# Patient Record
Sex: Female | Born: 1960 | Race: White | Hispanic: No | Marital: Married | Smoking: Never smoker
Health system: Southern US, Community
[De-identification: ages and names within clinical notes are randomized; demographics above are authoritative.]

## PROBLEM LIST (undated history)

## (undated) DIAGNOSIS — M810 Age-related osteoporosis without current pathological fracture: Secondary | ICD-10-CM

## (undated) DIAGNOSIS — F419 Anxiety disorder, unspecified: Secondary | ICD-10-CM

## (undated) DIAGNOSIS — E039 Hypothyroidism, unspecified: Secondary | ICD-10-CM

## (undated) DIAGNOSIS — E559 Vitamin D deficiency, unspecified: Secondary | ICD-10-CM

## (undated) DIAGNOSIS — F32A Depression, unspecified: Secondary | ICD-10-CM

## (undated) DIAGNOSIS — R413 Other amnesia: Secondary | ICD-10-CM

## (undated) HISTORY — DX: Anxiety disorder, unspecified: F41.9

## (undated) HISTORY — PX: BREAST BIOPSY: SHX20

## (undated) HISTORY — DX: Depression, unspecified: F32.A

## (undated) HISTORY — DX: Other amnesia: R41.3

## (undated) HISTORY — DX: Vitamin D deficiency, unspecified: E55.9

## (undated) HISTORY — PX: ABDOMINAL HYSTERECTOMY: SHX81

## (undated) HISTORY — PX: BREAST CYST ASPIRATION: SHX578

## (undated) HISTORY — PX: GALLBLADDER SURGERY: SHX652

## (undated) HISTORY — DX: Age-related osteoporosis without current pathological fracture: M81.0

## (undated) HISTORY — DX: Hypothyroidism, unspecified: E03.9

---

## 2006-01-15 HISTORY — PX: ABDOMINAL HYSTERECTOMY: SHX81

## 2018-05-29 ENCOUNTER — Encounter: Payer: Self-pay | Admitting: Cardiology

## 2018-05-29 ENCOUNTER — Other Ambulatory Visit: Payer: Self-pay

## 2018-05-29 ENCOUNTER — Ambulatory Visit (INDEPENDENT_AMBULATORY_CARE_PROVIDER_SITE_OTHER): Payer: BLUE CROSS/BLUE SHIELD | Admitting: Cardiology

## 2018-05-29 VITALS — BP 112/68 | HR 57 | Ht >= 80 in | Wt 128.9 lb

## 2018-05-29 DIAGNOSIS — R0789 Other chest pain: Secondary | ICD-10-CM | POA: Diagnosis not present

## 2018-05-29 DIAGNOSIS — R002 Palpitations: Secondary | ICD-10-CM | POA: Diagnosis not present

## 2018-05-29 NOTE — Progress Notes (Signed)
Primary Physician/Referring:  Holland Commons, FNP  Patient ID: Angie Robinson, female    DOB: January 05, 1961, 58 y.o.   MRN: [id number]  Chief Complaint  Patient presents with  . Palpitations  . New Patient (Initial Visit)    HPI: Angie Robinson  is a 58 y.o. female  with no significant past medical history referred to [place] by Dr. Deland Pretty for evaluation of palpitations.   Reports that a few weeks ago she had some chest tightness and palpitations. States that she was under a lot of stress at that time. Symptoms have been better since she has had less stress. Palpitations described as fluttering lasting for a few seconds. She has not had any further episodes. No associated shortness of breath.  Reports she had some breaking out in cold sweat at night. A few days ago had sensation of balloon inflating in her chest and then deflated. Generally occurs while sitting and resting, but she has correlated with this about thinking about something stressful or upseting.  She does run and bike 2 miles per day. She has not noticed symptoms with exertion. She has recently changed her diet and states that she had actually increased her salt intake a few years ago for palpitations that helped, but with her now being on a diet her salt intake is less and she actually feels worse.  She is originally from [place], [place] and moved to [facility] in November. She works as a Scientist, forensic.  History reviewed. No pertinent past medical history.  Past Surgical History:  Procedure Laterality Date  . ABDOMINAL HYSTERECTOMY    . GALLBLADDER SURGERY      Social History   Socioeconomic History  . Marital status: Married    Spouse name: Not on file  . Number of children: 3  . Years of education: Not on file  . Highest education level: Not on file  Occupational History  . Not on file  Social Needs  . Financial resource strain: Not on file  . Food insecurity:    Worry: Not on file    Inability: Not on file   . Transportation needs:    Medical: Not on file    Non-medical: Not on file  Tobacco Use  . Smoking status: Never Smoker  . Smokeless tobacco: Never Used  Substance and Sexual Activity  . Alcohol use: Not Currently  . Drug use: Not on file  . Sexual activity: Not on file  Lifestyle  . Physical activity:    Days per week: Not on file    Minutes per session: Not on file  . Stress: Not on file  Relationships  . Social connections:    Talks on phone: Not on file    Gets together: Not on file    Attends religious service: Not on file    Active member of club or organization: Not on file    Attends meetings of clubs or organizations: Not on file    Relationship status: Not on file  . Intimate partner violence:    Fear of current or ex partner: Not on file    Emotionally abused: Not on file    Physically abused: Not on file    Forced sexual activity: Not on file  Other Topics Concern  . Not on file  Social History Narrative  . Not on file    Current Outpatient Medications on File Prior to Visit  Medication Sig Dispense Refill  . Cholecalciferol (VITAMIN D3) 25 MCG (1000 UT)  CAPS Take by mouth daily.     No current facility-administered medications on file prior to visit.     Review of Systems  Constitution: Negative for decreased appetite, malaise/fatigue, weight gain and weight loss.  Eyes: Negative for visual disturbance.  Cardiovascular: Positive for chest pain and palpitations. Negative for claudication, dyspnea on exertion, leg swelling, orthopnea and syncope.  Respiratory: Negative for hemoptysis and wheezing.   Endocrine: Negative for cold intolerance and heat intolerance.  Hematologic/Lymphatic: Does not bruise/bleed easily.  Skin: Negative for nail changes.  Musculoskeletal: Negative for muscle weakness and myalgias.  Gastrointestinal: Negative for abdominal pain, change in bowel habit, nausea and vomiting.  Neurological: Negative for difficulty with  concentration, dizziness, focal weakness and headaches.  Psychiatric/Behavioral: Negative for altered mental status and suicidal ideas.  All other systems reviewed and are negative.     Objective  Blood pressure 112/68, pulse (!) 57, height 7' 8"  (2.337 m), weight 128 lb 14.4 oz (58.5 kg), SpO2 99 %. Body mass index is 10.71 kg/m.    Physical Exam  Constitutional: She is oriented to person, place, and time. Vital signs are normal. She appears well-developed and well-nourished.  HENT:  Head: Normocephalic and atraumatic.  Neck: Normal range of motion.  Cardiovascular: Normal rate, regular rhythm, normal heart sounds and intact distal pulses.  Pulmonary/Chest: Effort normal and breath sounds normal. No accessory muscle usage. No respiratory distress.  Abdominal: Soft. Bowel sounds are normal.  Musculoskeletal: Normal range of motion.  Neurological: She is alert and oriented to person, place, and time.  Skin: Skin is warm and dry.  Vitals reviewed.  Radiology: No results found.  Laboratory examination:   Labs 05/21/2018: CBC normal, serum glucose 82 mg, BUN 15, creatinine 0.8, eGFR greater than 60 mL.  Potassium 4.1, CMP otherwise normal.  Total cholesterol 176, triglycerides 56, HDL 63, LDL 102.  Non-HDL cholesterol 113.  TSH minimally elevated at 4.10 (0.36 - 3.74).  Urine analysis normal.  Vitamin D 24, reduced.  No flowsheet data found. No flowsheet data found. Lipid Panel  No results found for: CHOL, TRIG, HDL, CHOLHDL, VLDL, LDLCALC, LDLDIRECT HEMOGLOBIN A1C No results found for: HGBA1C, MPG TSH No results for input(s): TSH in the last 8760 hours.  Cardiac Studies:     Assessment   Atypical chest pain  Palpitations - Plan: EKG 12-Lead  EKG 05/29/2018: Sinus bradycardia at 56 bpm, normal axis, T wave inversion/ flattening in septal leads, cannot exclude septal infact, but likely normal variant for age. No evidence of ischemia.  Recommendations:   Patient has had  occasional episodes of atypical chest pain associated with increased stress as well as palpitations. She has not had any palpitations for the last few weeks and chest pain has overall improved since not being under significant stress. She is without history of hypertension, diabetes, or hyperlipidemia. She is exercising daily without exertional difficulties. She does have T wave inversion in V2 and T wave flattening in V3 that is likely normal variant for age, but cannot exclude septal infarct. As she has minimal risk factors and has had improvement in symptoms, do not feel that she needs cardiac testing at this time. Discussed continued primary prevention measures. Encouraged her to contact me for any worsening problems or for any new concerns. Will see her back on a PRN basis.    *I have discussed this case with Dr. Einar Gip and he personally examined the patient and participated in formulating the plan.*    Miquel Dunn, MSN,  APRN, FNP-C [facility] [place] Office: [phone] Fax: [phone]

## 2018-05-29 NOTE — Progress Notes (Deleted)
Duplicate note

## 2018-05-31 ENCOUNTER — Encounter: Payer: Self-pay | Admitting: Cardiology

## 2018-11-05 ENCOUNTER — Other Ambulatory Visit: Payer: Self-pay | Admitting: Internal Medicine

## 2018-11-05 DIAGNOSIS — Z1231 Encounter for screening mammogram for malignant neoplasm of breast: Secondary | ICD-10-CM

## 2018-12-16 DIAGNOSIS — U071 COVID-19: Secondary | ICD-10-CM

## 2018-12-16 HISTORY — DX: COVID-19: U07.1

## 2019-01-19 ENCOUNTER — Ambulatory Visit
Admission: RE | Admit: 2019-01-19 | Discharge: 2019-01-19 | Disposition: A | Discharge status: Home / Self Care | Payer: BLUE CROSS/BLUE SHIELD | Source: Ambulatory Visit | Attending: Internal Medicine | Admitting: Internal Medicine

## 2019-01-19 ENCOUNTER — Other Ambulatory Visit: Payer: Self-pay

## 2019-01-19 DIAGNOSIS — Z1231 Encounter for screening mammogram for malignant neoplasm of breast: Secondary | ICD-10-CM

## 2019-03-25 ENCOUNTER — Encounter: Payer: Self-pay | Admitting: *Deleted

## 2019-03-26 ENCOUNTER — Other Ambulatory Visit: Payer: Self-pay

## 2019-03-26 ENCOUNTER — Telehealth: Payer: Self-pay | Admitting: Neurology

## 2019-03-26 ENCOUNTER — Ambulatory Visit: Payer: BLUE CROSS/BLUE SHIELD | Admitting: Neurology

## 2019-03-26 ENCOUNTER — Encounter: Payer: Self-pay | Admitting: Neurology

## 2019-03-26 VITALS — BP 110/64 | HR 60 | Temp 97.3°F | Ht 62.0 in | Wt 125.0 lb

## 2019-03-26 DIAGNOSIS — R413 Other amnesia: Secondary | ICD-10-CM | POA: Diagnosis not present
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
## 2019-03-26 NOTE — Telephone Encounter (Signed)
Called patient and informed her Dr Terrace Arabia ordered a B12 lab on her. If b12 level is low or borderline Dr Terrace Arabia will probalby recommend a supplement. Patient verbalized understanding, appreciation.

## 2019-03-26 NOTE — Telephone Encounter (Signed)
Pt is needing to speak to RN. Pt states that her report says that Vitamine B12 was mentioned but she does not remember it being mentioned to her. Please advise.

## 2019-03-27 LAB — CBC WITH DIFFERENTIAL
Basophils Absolute: 0 10*3/uL (ref 0.0–0.2)
Basos: 1 %
EOS (ABSOLUTE): 0.1 10*3/uL (ref 0.0–0.4)
Eos: 1 %
Hematocrit: 42.5 % (ref 34.0–46.6)
Hemoglobin: 14 g/dL (ref 11.1–15.9)
Immature Grans (Abs): 0 10*3/uL (ref 0.0–0.1)
Immature Granulocytes: 0 %
Lymphocytes Absolute: 1.5 10*3/uL (ref 0.7–3.1)
Lymphs: 42 %
MCH: 31.6 pg (ref 26.6–33.0)
MCHC: 32.9 g/dL (ref 31.5–35.7)
MCV: 96 fL (ref 79–97)
Monocytes Absolute: 0.3 10*3/uL (ref 0.1–0.9)
Monocytes: 9 %
Neutrophils Absolute: 1.7 10*3/uL (ref 1.4–7.0)
Neutrophils: 47 %
RBC: 4.43 x10E6/uL (ref 3.77–5.28)
RDW: 12.4 % (ref 11.7–15.4)
WBC: 3.6 10*3/uL (ref 3.4–10.8)

## 2019-03-27 LAB — COMPREHENSIVE METABOLIC PANEL
ALT: 10 IU/L (ref 0–32)
AST: 17 IU/L (ref 0–40)
Albumin/Globulin Ratio: 2.1 (ref 1.2–2.2)
Albumin: 4.5 g/dL (ref 3.8–4.9)
Alkaline Phosphatase: 52 IU/L (ref 39–117)
BUN/Creatinine Ratio: 22 (ref 9–23)
BUN: 17 mg/dL (ref 6–24)
Bilirubin Total: 0.3 mg/dL (ref 0.0–1.2)
CO2: 24 mmol/L (ref 20–29)
Calcium: 9.1 mg/dL (ref 8.7–10.2)
Chloride: 102 mmol/L (ref 96–106)
Creatinine, Ser: 0.78 mg/dL (ref 0.57–1.00)
GFR calc Af Amer: 98 mL/min/{1.73_m2} (ref >59)
GFR calc non Af Amer: 85 mL/min/{1.73_m2} (ref >59)
Globulin, Total: 2.1 g/dL (ref 1.5–4.5)
Glucose: 89 mg/dL (ref 65–99)
Potassium: 4.4 mmol/L (ref 3.5–5.2)
Sodium: 138 mmol/L (ref 134–144)
Total Protein: 6.6 g/dL (ref 6.0–8.5)

## 2019-03-27 LAB — TSH: TSH: 1.49 u[IU]/mL (ref 0.450–4.500)

## 2019-03-27 LAB — RPR: RPR Ser Ql: NONREACTIVE

## 2019-03-27 LAB — VITAMIN B12: Vitamin B-12: 758 pg/mL (ref 232–1245)

## 2019-03-30 ENCOUNTER — Telehealth: Payer: Self-pay | Admitting: *Deleted

## 2019-03-30 NOTE — Telephone Encounter (Signed)
I spoke to the patient and provided her with the lab results below.

## 2019-03-30 NOTE — Telephone Encounter (Signed)
-----   Message from Levert Feinstein, MD sent at 03/30/2019  3:12 PM EDT ----- Please call patient for normal laboratory result

## 2019-04-07 ENCOUNTER — Telehealth: Payer: Self-pay | Admitting: Neurology

## 2019-04-07 NOTE — Telephone Encounter (Signed)
LVM for pt to call back about scheduling mri  [employer] Auth: [employer] spoke to [place] Ref # [id number]

## 2019-04-07 NOTE — Telephone Encounter (Signed)
spoke to the patient she would like to go to GI order sent to GI patient is aware

## 2019-04-20 ENCOUNTER — Ambulatory Visit: Payer: BLUE CROSS/BLUE SHIELD | Admitting: Neurology

## 2019-04-20 ENCOUNTER — Other Ambulatory Visit: Payer: Self-pay

## 2019-04-20 DIAGNOSIS — R410 Disorientation, unspecified: Secondary | ICD-10-CM | POA: Diagnosis not present

## 2019-04-20 DIAGNOSIS — R413 Other amnesia: Secondary | ICD-10-CM

## 2019-04-23 ENCOUNTER — Telehealth: Payer: Self-pay | Admitting: Neurology

## 2019-04-23 NOTE — Telephone Encounter (Signed)
Open in error

## 2019-04-23 NOTE — Procedures (Signed)
   HISTORY: 59 year old female presented with intermittent memory loss  TECHNIQUE:  This is a routine 16 channel EEG recording with one channel devoted to a limited EKG recording.  It was performed during wakefulness, drowsiness and asleep.  Hyperventilation and photic stimulation were performed as activating procedures.  There are minimum muscle and movement artifact noted.  Upon maximum arousal, posterior dominant waking rhythm consistent of rhythmic alpha range activity, with frequency of 10 hz. Activities are symmetric over the bilateral posterior derivations and attenuated with eye opening.  Hyperventilation produced mild/moderate buildup with higher amplitude and the slower activities noted.  Photic stimulation did not alter the tracing.  During EEG recording, patient developed drowsiness and entered sleep, sleep EEG demonstrated architecture, there were frontal centrally dominant vertex waves and symmetric sleep spindles noted.  During EEG recording, there was no epileptiform discharge noted.  EKG demonstrate sinus rhythm, with heart rate of 42 beats a minute  CONCLUSION: This is a  normal awake and asleep EEG.  There is no electrodiagnostic evidence of epileptiform discharge.  Levert Feinstein, M.D. Ph.D.  [facility] [address] [place], [place] [address] Phone: [phone] Fax:      [phone]

## 2019-05-06 ENCOUNTER — Inpatient Hospital Stay: Admission: RE | Admit: 2019-05-06 | Payer: BLUE CROSS/BLUE SHIELD | Source: Ambulatory Visit

## 2019-05-07 ENCOUNTER — Ambulatory Visit: Payer: Self-pay | Attending: Internal Medicine

## 2019-05-07 DIAGNOSIS — Z23 Encounter for immunization: Secondary | ICD-10-CM

## 2019-05-07 NOTE — Progress Notes (Signed)
   Covid-19 Vaccination Clinic  Name:  Dnyla Antonetti    MRN: [id number] DOB: [birth year]  05/07/2019  Ms. Heppler was observed post Covid-19 immunization for 15 minutes without incident. She was provided with Vaccine Information Sheet and instruction to access the V-Safe system.   Ms. Lett was instructed to call 911 with any severe reactions post vaccine: Marland Kitchen Difficulty breathing  . Swelling of face and throat  . A fast heartbeat  . A bad rash all over body  . Dizziness and weakness   Immunizations Administered    Name Date Dose VIS Date Route   Pfizer COVID-19 Vaccine 05/07/2019  3:48 PM 0.3 mL 03/11/2018 Intramuscular   Manufacturer: [employer], [employer]   Lot: [id number]   NDC: [address]-8

## 2019-05-25 ENCOUNTER — Telehealth: Payer: Self-pay | Admitting: Neurology

## 2019-05-25 NOTE — Telephone Encounter (Signed)
Per Dr. Zannie Cove EEG result note from 04/23/19: "This is a  normal awake and asleep EEG.  There is no electrodiagnostic evidence of epileptiform discharge."  I called pt. I advised her of the EEG results. Pt reports that her symptoms are better. Her PCP advised her that her symptoms may have been COVID related.   She is unsure if she wants to proceed with the MRI since her symptoms are better. She has a follow up in September with Maralyn Sago, NP to discuss her memory loss further. She may decide to delay MRI until after that visit. She will let us know if she changes her mind regarding the MRI in the meantime.  Pt verbalized understanding of results. Pt had no further questions at this time but will call back if questions or concerns arise.

## 2019-05-25 NOTE — Telephone Encounter (Signed)
Pt called wanting to be informed of her EEG result's. Please advise.

## 2019-06-01 ENCOUNTER — Ambulatory Visit: Payer: BLUE CROSS/BLUE SHIELD | Attending: Internal Medicine

## 2019-06-01 DIAGNOSIS — Z23 Encounter for immunization: Secondary | ICD-10-CM

## 2019-06-01 NOTE — Progress Notes (Signed)
   Covid-19 Vaccination Clinic  Name:  Angie Robinson    MRN: [id number] DOB: 09-17-1960  06/01/2019  [name] was observed post Covid-19 immunization for 15 minutes without incident. She was provided with Vaccine Information Sheet and instruction to access the V-Safe system.   [name] was instructed to call 911 with any severe reactions post vaccine: Marland Kitchen Difficulty breathing  . Swelling of face and throat  . A fast heartbeat  . A bad rash all over body  . Dizziness and weakness   Immunizations Administered    Name Date Dose VIS Date Route   Pfizer COVID-19 Vaccine 06/01/2019  3:38 PM 0.3 mL 03/11/2018 Intramuscular   Manufacturer: [employer], [employer]   Lot: [id number]   NDC: [address]-2

## 2019-09-24 ENCOUNTER — Ambulatory Visit: Payer: BLUE CROSS/BLUE SHIELD | Admitting: Neurology

## 2020-04-08 ENCOUNTER — Other Ambulatory Visit: Payer: Self-pay | Admitting: Internal Medicine

## 2020-04-08 DIAGNOSIS — Z1231 Encounter for screening mammogram for malignant neoplasm of breast: Secondary | ICD-10-CM

## 2020-05-31 ENCOUNTER — Other Ambulatory Visit: Payer: Self-pay

## 2020-05-31 ENCOUNTER — Ambulatory Visit
Admission: RE | Admit: 2020-05-31 | Discharge: 2020-05-31 | Disposition: A | Discharge status: Home / Self Care | Payer: BLUE CROSS/BLUE SHIELD | Source: Ambulatory Visit | Attending: Internal Medicine | Admitting: Internal Medicine

## 2020-05-31 DIAGNOSIS — Z1231 Encounter for screening mammogram for malignant neoplasm of breast: Secondary | ICD-10-CM

## 2021-06-05 ENCOUNTER — Other Ambulatory Visit: Payer: Self-pay | Admitting: Registered Nurse

## 2021-06-05 DIAGNOSIS — E7800 Pure hypercholesterolemia, unspecified: Secondary | ICD-10-CM

## 2021-06-09 ENCOUNTER — Ambulatory Visit
Admission: RE | Admit: 2021-06-09 | Discharge: 2021-06-09 | Disposition: A | Discharge status: Home / Self Care | Payer: No Typology Code available for payment source | Source: Ambulatory Visit | Attending: Registered Nurse | Admitting: Registered Nurse

## 2021-06-09 DIAGNOSIS — E7800 Pure hypercholesterolemia, unspecified: Secondary | ICD-10-CM

## 2021-07-17 ENCOUNTER — Other Ambulatory Visit: Payer: BLUE CROSS/BLUE SHIELD

## 2021-07-17 ENCOUNTER — Encounter: Payer: Self-pay | Admitting: Physician Assistant

## 2021-07-17 ENCOUNTER — Ambulatory Visit (INDEPENDENT_AMBULATORY_CARE_PROVIDER_SITE_OTHER): Payer: BLUE CROSS/BLUE SHIELD | Admitting: Physician Assistant

## 2021-07-17 VITALS — BP 113/74 | HR 61 | Ht 62.0 in | Wt 148.4 lb

## 2021-07-17 DIAGNOSIS — R2689 Other abnormalities of gait and mobility: Secondary | ICD-10-CM

## 2021-07-17 DIAGNOSIS — M810 Age-related osteoporosis without current pathological fracture: Secondary | ICD-10-CM

## 2021-07-17 DIAGNOSIS — Z87820 Personal history of traumatic brain injury: Secondary | ICD-10-CM

## 2021-07-17 DIAGNOSIS — F411 Generalized anxiety disorder: Secondary | ICD-10-CM | POA: Diagnosis not present

## 2021-07-17 DIAGNOSIS — E7800 Pure hypercholesterolemia, unspecified: Secondary | ICD-10-CM

## 2021-07-17 DIAGNOSIS — F902 Attention-deficit hyperactivity disorder, combined type: Secondary | ICD-10-CM

## 2021-07-17 DIAGNOSIS — F32A Depression, unspecified: Secondary | ICD-10-CM

## 2021-07-17 DIAGNOSIS — E039 Hypothyroidism, unspecified: Secondary | ICD-10-CM

## 2021-07-17 DIAGNOSIS — E559 Vitamin D deficiency, unspecified: Secondary | ICD-10-CM

## 2021-07-17 MED ORDER — AMPHETAMINE-DEXTROAMPHETAMINE 10 MG PO TABS: ORAL_TABLET | ORAL | 0 refills | Status: DC

## 2021-07-17 NOTE — Progress Notes (Signed)
[facility] MD/PA/NP Initial Note  07/17/2021 1:00 PM Angie Robinson  MRN:  [id number]  Chief Complaint:  Chief Complaint   Establish Care     HPI: Referred by Angie Chester, NP at [facility].  Having problems focusing.  Has had issues all her life, but has gotten worse over the past few years to the point it's affecting her job. Works for [employer] and has to be on top of everything at all times. Gets distracted very easy, gets bored and restless in meetings (thankfully online, so people don't notice) and doesn't hear or comprehend half of what people are saying. She's able to cover things up some but not sure how long she can do that before it negatively affects her job and she may be let go. Job is very stressful so that may be causing some of it. She transferred positions last summer, which was a good move, but still a very stressful environment.   Concerned about possibility of dementia. Has been getting more forgetful, worse in past year. Possibly when she started Lexapro but not sure. Has been experiencing it for at least 5 years though, maybe longer. She'll ask her husband things that he's already told her several times. He gets aggravated with her. Can't name objects in the house and say's 'that thing' a lot, trying to describe to husband what she's referring to. No problems understanding function of an object, like a toothbrush or hairbrush. Not getting lost when driving. No fm h/o early on-set dementia, although Dad has it, is in late 21s.  Also of importance is a TBI many years ago when a horse 'ran over' her. Had frontal and occipital bleed, did not require craniotomy. Lost her memory for awhile, but has been able to lead a nl life.  Reports falling a lot, not sure if that is related to the TBI or has just started in the past few years.  It is usually much worse when she is walking up or down stairs.  The first or the last step is always tricky.  She has fallen  many times tripping over 1 of those.  It is bad enough that her kids will not let her walk with their kids in her arms.  She can hold them once she sitting down but they are concerned she will fall while holding them and both she and their child would get hurt.  Saw Neuro, Dr. Terrace Robinson, over 2 years ago (which she forgot about until I asked if she'd ever had work-up) who recommended labs, MRI brain, EEG, and neuropsych eval. Pt doesn't remember any tests done, chart review shows EEG which was normal both awake and asleep. Phone note 05/25/19 to Dr. Terrace Robinson states pt was better and wanted to hold off on MRI. Was supposed to f/u 09/2019 but I don't see a record of that on chart. Labs on 03/26/2019 CBC nl, CMP nl, TSH nl, B12 nl, RPR non-reactive.  Was started on Lexapro around a year ago, for anxiety. It's helped a lot, but sometimes does get overwhelmed. No PA, just a sense of dread at times. Able to get through it without difficulty though.   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 Not isolating but her circle is small b/c works from home and spends time mostly with family. Sleeps  ok. No different than nl. ADLs and hygiene are nl. Appetite and weight are stable. No SI/HI.  No increased energy with decreased need for sleep, no increased talkativeness, no racing thoughts, no impulsivity or risky behaviors, no increased spending, no increased libido, no grandiosity, no increased irritability or anger, no paranoia, and no hallucinations.   Visit Diagnosis:    ICD-10-CM   1. Attention deficit hyperactivity disorder (ADHD), combined type  F90.2     2. Imbalance  R26.89     3. Generalized anxiety disorder  F41.1     4. History of traumatic brain injury  Z87.820     5. Mild depression  F32.A     6. Hypovitaminosis D  E55.9     7. Acquired hypothyroidism   E03.9     8. Osteoporosis, unspecified osteoporosis type, unspecified pathological fracture presence  M81.0     9. Pure hypercholesterolemia  E78.00       Past Psychiatric History:   H/o anxiety, depression   Past medications for mental health diagnoses include: Lexapro has helped anxiety, Zoloft for a long time but quit working.   H/O of TBI.  Cracked her skull after a horse ran over her, had bleeding, frontal and occipital, didn't need surgery, the bleeding finally resolved. Lost her memory for awhile.    Past Medical History:  Past Medical History:  Diagnosis Date   Anxiety    COVID-19 12/2018   Depression    Hypothyroid    Memory loss    Osteoporosis    Vitamin D deficiency     Past Surgical History:  Procedure Laterality Date   ABDOMINAL HYSTERECTOMY  2008   BREAST BIOPSY     unsure laterality or date   BREAST CYST ASPIRATION     multiple when she was young   GALLBLADDER SURGERY      Family Psychiatric History: see below  Family History:  Family History  Problem Relation Age of Onset   Depression Mother    Ovarian cancer Mother    Dementia Father    Depression Sister    Anxiety disorder Brother    Depression Brother    Depression Maternal Grandfather    Dementia Maternal Grandmother    Alcohol abuse Paternal Grandfather    Dementia Paternal Grandmother    Anxiety disorder Daughter    Anxiety disorder Daughter    Anxiety disorder Daughter     Social History:  Social History   Socioeconomic History   Marital status: Married    Spouse name: Angie Robinson   Number of children: 3   Years of education: Not on file   Highest education level: Bachelor's degree (e.g., BA, AB, BS)  Occupational History    Comment: [employer]  Tobacco Use   Smoking status: Never   Smokeless tobacco: Never  Vaping Use   Vaping Use: Never used  Substance and Sexual Activity   Alcohol use: Not Currently   Drug use: Never   Sexual activity: Not on file  Other Topics Concern    Not on file  Social History Narrative   She works for [employer]. Works from home, Proofreader.   Lives with husband, on a farm. Kids are grown and on their own.      Legal-none   Caffeine-1 serving   Religion-Christian   Social Determinants of Health   Financial Resource Strain: Not on file  Food Insecurity: No Food Insecurity (07/17/2021)   Hunger Vital Sign    Worried About Running Out of Food  in the Last Year: Never true    Ran Out of Food in the Last Year: Never true  Transportation Needs: No Transportation Needs (07/17/2021)   PRAPARE - Administrator, Civil Service (Medical): No    Lack of Transportation (Non-Medical): No  Physical Activity: Sufficiently Active (07/17/2021)   Exercise Vital Sign    Days of Exercise per Week: 7 days    Minutes of Exercise per Session: 150+ min  Stress: Stress Concern Present (07/17/2021)   [employer] of Occupational Health - Occupational Stress Questionnaire    Feeling of Stress : Rather much  Social Connections: Moderately Integrated (07/17/2021)   Social Connection and Isolation Panel [NHANES]    Frequency of Communication with Friends and Family: More than three times a week    Frequency of Social Gatherings with Friends and Family: More than three times a week    Attends Religious Services: More than 4 times per year    Active Member of [employer] or Organizations: No    Attends Banker Meetings: Never    Marital Status: Married    Allergies:  Allergies  Allergen Reactions   Other     gluten   Ivp Dye [Iodinated Contrast Media] Rash    Metabolic Disorder Labs: No results found for: "HGBA1C", "MPG" No results found for: "PROLACTIN" No results found for: "CHOL", "TRIG", "HDL", "CHOLHDL", "VLDL", "LDLCALC" Lab Results  Component Value Date   TSH 1.490 03/26/2019    Therapeutic Level Labs: No results found for: "LITHIUM" No results found for: "VALPROATE" No results found for: "CBMZ"  Current  Medications: Current Outpatient Medications  Medication Sig Dispense Refill   amphetamine-dextroamphetamine (ADDERALL) 10 MG tablet 1/2 po qam for 2 weeks and then may increase to 1 po q AM. 30 tablet 0   calcium carbonate (TUMS - DOSED IN MG ELEMENTAL CALCIUM) 500 MG chewable tablet Chew 1 tablet by mouth daily.     Cholecalciferol (VITAMIN D3) 25 MCG (1000 UT) CAPS Take by mouth daily.     denosumab (PROLIA) 60 MG/ML SOSY injection Inject 60 mg into the skin every 6 (six) months.     escitalopram (LEXAPRO) 10 MG tablet Take 10 mg by mouth daily.     levothyroxine (SYNTHROID) 25 MCG tablet Take 25 mcg by mouth daily before breakfast.     montelukast (SINGULAIR) 10 MG tablet Take 10 mg by mouth daily.     No current facility-administered medications for this visit.    Medication Side Effects: none  Orders placed this visit:  No orders of the defined types were placed in this encounter.   Psychiatric Specialty Exam:  Review of Systems  Constitutional:  Positive for fatigue.  HENT: Negative.    Eyes: Negative.   Respiratory: Negative.    Cardiovascular: Negative.   Gastrointestinal: Negative.   Endocrine: Negative.   Genitourinary: Negative.   Musculoskeletal: Negative.   Skin: Negative.   Allergic/Immunologic: Negative.   Neurological:        Imbalance. Falls. Memory loss. See HPI.  Hematological: Negative.   Psychiatric/Behavioral:         See HPI    Blood pressure 113/74, pulse 61, height 5\' 2"  (1.575 m), weight 148 lb 6.4 oz (67.3 kg).Body mass index is 27.14 kg/m.  General Appearance: Casual and Well Groomed  Eye Contact:  Good  Speech:  Clear and Coherent and Normal Rate  Volume:  Normal  Mood:  Euthymic  Affect:  Congruent  Thought Process:  Goal Directed  and Descriptions of Associations: Circumstantial  Orientation:  Full (Time, Place, and Person)  Thought Content: Logical   Suicidal Thoughts:  No  Homicidal Thoughts:  No  Memory:   Immediate and recent  memory are decreased.  Remote memory is good most of the time, however surrounding the head injury there are times when she has no memory of those events  Judgement:  Good  Insight:  Good  Psychomotor Activity:  Normal  Concentration:  Concentration: Poor and Attention Span: Poor  Recall:  Good  Fund of Knowledge: Good  Language: Good  Assets:  Desire for Improvement Financial Resources/Insurance Housing Transportation Vocational/Educational  ADL's:  Intact  Cognition: WNL  Prognosis:  Good   See HPI and notes on chart for previous neurology work-up including labs and EEG per Dr. Terrace Robinson in early 2021.  Labs 05/29/2021 CBC normal CMP normal Cholesterol 213, HDL 73, LDL 124, triglycerides 79 TSH 2.39 Vitamin D 29.4  Screenings:  GAD-7    Flowsheet Row Office Visit from 07/17/2021 in [facility]  Total GAD-7 Score 9      Mini-Mental    Flowsheet Row Office Visit from 07/17/2021 in [facility] Office Visit from 03/26/2019 in [facility]  Total Score (max 30 points ) 30 29      PHQ2-9    Flowsheet Row Office Visit from 07/17/2021 in [facility]  PHQ-2 Total Score 1      Animal naming Test 25  Receiving Psychotherapy: No   Treatment Plan/Recommendations:  PDMP reviewed. No recent controlled meds. I provided 65 minutes of face to face time during this encounter, including time spent before and after the visit in records review, medical decision making, counseling pertinent to today's visit, and charting.   Discussed sx of inattention and memory loss, which could be caused from stress, anxiety, depression, untreated ADHD which I think she's had for a long time,  old TBI, and/or dementia.  Several treatment options include:  adding a stimulant for ADHD which can help w/ memory as well, b/c it may be from decreased focus. wean off Lexapro (since she thinks memory worsened when starting it although I don't  think it's the cause) and add Buspar for anxiety instead.  She understands that BuSpar will not help with depression though. Definitely refer back to Neuro, she prefers to get another opinion so will refer to Dr Tat or Dr Karel Jarvis.   We agree to start Adderall and leave the Lexapro the same for now.  I will hold off on BuSpar but those options are still on the table.  We discussed benefits, risks and side effects of Adderall and she accepts.  After I saw the patient I reviewed her labs from 05/29/2021 in further detail.  Her vitamin D is low normal, and for mental health reasons it is best to have vitamin D level at least 50-70, which can improve symptoms of depression.  I discussed this with Angie Chester, NP patient's primary provider who is okay with me starting her on prescription strength vitamin D.  I will recheck the level in 4 to 6 weeks or so.  I will discuss with the patient further at the next visit but in the meantime I will ask one of the CMAs to call her to let her know of this plan.  Start vitamin D 50,000 IUs, 1 p.o. q. 7 days. Start Adderall 10 mg, one half p.o. every morning for 2 weeks and then may increase to 1  p.o. every morning but if she is responding well to 1/2 pill, stay at that. Continue Lexapro 10 mg, 1 p.o. daily. Continue vitamin D 1000 IUs daily.  Recommend multivitamin, B complex, and fish oil. Consider therapy. Return in 4 weeks.  Melony Overly, PA-C

## 2021-07-19 DIAGNOSIS — E7800 Pure hypercholesterolemia, unspecified: Secondary | ICD-10-CM | POA: Insufficient documentation

## 2021-07-19 DIAGNOSIS — F411 Generalized anxiety disorder: Secondary | ICD-10-CM | POA: Insufficient documentation

## 2021-07-19 DIAGNOSIS — M810 Age-related osteoporosis without current pathological fracture: Secondary | ICD-10-CM | POA: Insufficient documentation

## 2021-07-19 DIAGNOSIS — R2689 Other abnormalities of gait and mobility: Secondary | ICD-10-CM | POA: Insufficient documentation

## 2021-07-19 DIAGNOSIS — F32A Depression, unspecified: Secondary | ICD-10-CM | POA: Insufficient documentation

## 2021-07-19 DIAGNOSIS — F902 Attention-deficit hyperactivity disorder, combined type: Secondary | ICD-10-CM | POA: Insufficient documentation

## 2021-07-19 DIAGNOSIS — E039 Hypothyroidism, unspecified: Secondary | ICD-10-CM | POA: Insufficient documentation

## 2021-07-19 DIAGNOSIS — E559 Vitamin D deficiency, unspecified: Secondary | ICD-10-CM | POA: Insufficient documentation

## 2021-07-19 DIAGNOSIS — Z87820 Personal history of traumatic brain injury: Secondary | ICD-10-CM | POA: Insufficient documentation

## 2021-07-19 MED ORDER — VITAMIN D (ERGOCALCIFEROL) 1.25 MG (50000 UNIT) PO CAPS: 50000.0000 [IU] | ORAL_CAPSULE | ORAL | 0 refills | Status: DC

## 2021-07-20 ENCOUNTER — Telehealth: Payer: Self-pay | Admitting: Physician Assistant

## 2021-07-20 NOTE — Telephone Encounter (Signed)
Referral has been sent to [facility]

## 2021-07-20 NOTE — Telephone Encounter (Signed)
Noted  

## 2021-08-09 ENCOUNTER — Other Ambulatory Visit: Payer: Self-pay | Admitting: Internal Medicine

## 2021-08-09 DIAGNOSIS — Z1231 Encounter for screening mammogram for malignant neoplasm of breast: Secondary | ICD-10-CM

## 2021-08-14 ENCOUNTER — Ambulatory Visit (INDEPENDENT_AMBULATORY_CARE_PROVIDER_SITE_OTHER): Payer: BLUE CROSS/BLUE SHIELD | Admitting: Physician Assistant

## 2021-08-14 ENCOUNTER — Encounter: Payer: Self-pay | Admitting: Physician Assistant

## 2021-08-14 DIAGNOSIS — R7989 Other specified abnormal findings of blood chemistry: Secondary | ICD-10-CM | POA: Diagnosis not present

## 2021-08-14 DIAGNOSIS — F32A Depression, unspecified: Secondary | ICD-10-CM

## 2021-08-14 DIAGNOSIS — Z79899 Other long term (current) drug therapy: Secondary | ICD-10-CM

## 2021-08-14 DIAGNOSIS — F902 Attention-deficit hyperactivity disorder, combined type: Secondary | ICD-10-CM

## 2021-08-14 DIAGNOSIS — Z87820 Personal history of traumatic brain injury: Secondary | ICD-10-CM

## 2021-08-14 MED ORDER — AMPHETAMINE-DEXTROAMPHETAMINE 10 MG PO TABS: 5.0000 mg | ORAL_TABLET | Freq: Two times a day (BID) | ORAL | 0 refills | Status: DC

## 2021-08-14 NOTE — Progress Notes (Signed)
[facility] Med Check  Patient ID: LAURENCIA ROMA,  MRN: [id number]  PCP: Merri Brunette, MD  Date of Evaluation: 08/14/2021 Time spent:30 minutes  Chief Complaint:  Chief Complaint   ADHD; Follow-up     HISTORY/CURRENT STATUS: HPI  For routine f/u.  Started Adderall last month.  States it is working really well.  She has been taking 1/2 pill at breakfast and then another half at lunch.  She did try to increase it to 1 pill every morning but the one time she tried that she had a headache all day so she has not tried it again.  It is helping her stay on task more and not get so easily distracted that she cannot do things that are needed.  States she feels better than she has in 30 years.  Patient is able to enjoy things.  Energy and motivation are good.  Work is going well.   No extreme sadness, tearfulness, or feelings of hopelessness.  Sleeps well most of the time. ADLs and personal hygiene are normal.  Appetite has not changed.  Weight is stable.  Denies suicidal or homicidal thoughts.  Vitamin D 50,000 IUs was started at our last visit.  No other changes were made except of course the Adderall.  I had referred her to [facility] neurology and she never got a phone call from them.  Her mother-in-law has been staying with her and her husband for the past 3 weeks so that has been stressful but otherwise no changes in social situation.  Denies dizziness, syncope, seizures, numbness, tingling, tremor, tics, unsteady gait, slurred speech, confusion. Denies muscle or joint pain, stiffness, or dystonia.  Individual Medical History/ Review of Systems: Changes? :No   Past Psychiatric History:    H/o anxiety, depression    Past medications for mental health diagnoses include: Lexapro has helped anxiety, Zoloft for a long time but quit working.    H/O of TBI.  Cracked her skull after a horse ran over her, had bleeding, frontal and occipital, didn't need surgery, the bleeding finally  resolved. Lost her memory for awhile.   Allergies: Other and Ivp dye [iodinated contrast media]  Current Medications:  Current Outpatient Medications:    [START ON 09/15/2021] amphetamine-dextroamphetamine (ADDERALL) 10 MG tablet, Take 0.5 tablets (5 mg total) by mouth 2 (two) times daily with breakfast and lunch., Disp: 30 tablet, Rfl: 0   [START ON 08/16/2021] amphetamine-dextroamphetamine (ADDERALL) 10 MG tablet, Take 0.5 tablets (5 mg total) by mouth 2 (two) times daily with breakfast and lunch., Disp: 30 tablet, Rfl: 0   calcium carbonate (TUMS - DOSED IN MG ELEMENTAL CALCIUM) 500 MG chewable tablet, Chew 1 tablet by mouth daily., Disp: , Rfl:    Vitamin D, Ergocalciferol, (DRISDOL) 1.25 MG (50000 UNIT) CAPS capsule, Take 1 capsule (50,000 Units total) by mouth every 7 (seven) days., Disp: 12 capsule, Rfl: 0   [START ON 10/14/2021] amphetamine-dextroamphetamine (ADDERALL) 10 MG tablet, Take 0.5 tablets (5 mg total) by mouth 2 (two) times daily with breakfast and lunch., Disp: 30 tablet, Rfl: 0   Cholecalciferol (VITAMIN D3) 25 MCG (1000 UT) CAPS, Take by mouth daily., Disp: , Rfl:    denosumab (PROLIA) 60 MG/ML SOSY injection, Inject 60 mg into the skin every 6 (six) months., Disp: , Rfl:    escitalopram (LEXAPRO) 10 MG tablet, Take 10 mg by mouth daily., Disp: , Rfl:    levothyroxine (SYNTHROID) 25 MCG tablet, Take 25 mcg by mouth daily before breakfast., Disp: , Rfl:  montelukast (SINGULAIR) 10 MG tablet, Take 10 mg by mouth daily., Disp: , Rfl:  Medication Side Effects: none  Family Medical/ Social History: Changes?  See HPI  MENTAL HEALTH EXAM:  There were no vitals taken for this visit.There is no height or weight on file to calculate BMI.  General Appearance: Casual and Well Groomed  Eye Contact:  Good  Speech:  Clear and Coherent and Normal Rate  Volume:  Normal  Mood:  Euthymic  Affect:  Congruent  Thought Process:  Goal Directed and Descriptions of Associations: Circumstantial   Orientation:  Full (Time, Place, and Person)  Thought Content: Logical   Suicidal Thoughts:  No  Homicidal Thoughts:  No  Memory:  WNL  Judgement:  Good  Insight:  Good  Psychomotor Activity:  Normal  Concentration:  Concentration: Good and Attention Span: Good  Recall:  Good  Fund of Knowledge: Good  Language: Good  Assets:  Desire for Improvement  ADL's:  Intact  Cognition: WNL  Prognosis:  Good    DIAGNOSES:    ICD-10-CM   1. Attention deficit hyperactivity disorder (ADHD), combined type  F90.2     2. Encounter for long-term (current) use of medications  Z79.899 VITAMIN D 25 Hydroxy (Vit-D Deficiency, Fractures)    3. Low vitamin D level  R79.89 VITAMIN D 25 Hydroxy (Vit-D Deficiency, Fractures)    4. Mild depression  F32.A     5. History of traumatic brain injury  Z87.820       Receiving Psychotherapy: No    RECOMMENDATIONS:  PDMP reviewed.  Adderall filled 07/17/2021. I provided 30 minutes of face to face time during this encounter, including time spent before and after the visit in records review, medical decision making, counseling pertinent to today's visit, and charting.   I am glad to see her feeling so much better!  No change with the Adderall will be made.  Will recheck vitamin D level anywhere between 2 to 4 weeks from now.  Depending on the level then she can go from the prescription strength weekly dose down to daily OTC dosing after the total of 3 months.  Phone number for [facility] neurology was given to her, asking her to call them to schedule an appointment as the referral has already been sent.  Continue Adderall 10 mg, 1/2 pill at breakfast, 1/2 pill at lunch. Continue Lexapro 10 mg, 1 p.o. daily. Continue vitamin D 50,000 IUs once a week. Continue multivitamin and calcium daily. Lab ordered as above. Return in 3 months.  Melony Overly, PA-C

## 2021-08-18 ENCOUNTER — Ambulatory Visit
Admission: RE | Admit: 2021-08-18 | Discharge: 2021-08-18 | Disposition: A | Discharge status: Home / Self Care | Payer: BLUE CROSS/BLUE SHIELD | Source: Ambulatory Visit | Attending: Internal Medicine | Admitting: Internal Medicine

## 2021-08-18 DIAGNOSIS — Z1231 Encounter for screening mammogram for malignant neoplasm of breast: Secondary | ICD-10-CM

## 2021-08-22 ENCOUNTER — Telehealth: Payer: Self-pay | Admitting: Physician Assistant

## 2021-08-22 NOTE — Telephone Encounter (Signed)
Canceled all Rx at [facility] on [address]

## 2021-08-22 NOTE — Telephone Encounter (Signed)
Pt requested to change her refills of Adderall scripts from [facility] to [facility] (starting with the 9/1).  Phone [phone].  Next appt 11/1

## 2021-08-29 NOTE — Telephone Encounter (Signed)
Last filled 8/5. Future refills mail order thru [facility].

## 2021-09-01 ENCOUNTER — Other Ambulatory Visit: Payer: Self-pay

## 2021-09-01 MED ORDER — AMPHETAMINE-DEXTROAMPHETAMINE 10 MG PO TABS
5.0000 mg | ORAL_TABLET | Freq: Two times a day (BID) | ORAL | 0 refills | Status: DC
Start: 2021-09-17 — End: 2021-12-11

## 2021-09-01 MED ORDER — AMPHETAMINE-DEXTROAMPHETAMINE 10 MG PO TABS
5.0000 mg | ORAL_TABLET | Freq: Two times a day (BID) | ORAL | 0 refills | Status: DC
Start: 2021-10-16 — End: 2021-12-11

## 2021-09-01 NOTE — Telephone Encounter (Signed)
Pended.

## 2021-09-05 ENCOUNTER — Encounter: Payer: Self-pay | Admitting: Physician Assistant

## 2021-09-13 ENCOUNTER — Other Ambulatory Visit (INDEPENDENT_AMBULATORY_CARE_PROVIDER_SITE_OTHER): Payer: BLUE CROSS/BLUE SHIELD

## 2021-09-13 ENCOUNTER — Encounter: Payer: Self-pay | Admitting: Physician Assistant

## 2021-09-13 ENCOUNTER — Ambulatory Visit: Payer: BLUE CROSS/BLUE SHIELD | Admitting: Physician Assistant

## 2021-09-13 VITALS — BP 117/73 | HR 63 | Resp 18 | Ht 62.0 in | Wt 149.0 lb

## 2021-09-13 DIAGNOSIS — R4182 Altered mental status, unspecified: Secondary | ICD-10-CM | POA: Diagnosis not present

## 2021-09-13 DIAGNOSIS — R413 Other amnesia: Secondary | ICD-10-CM | POA: Diagnosis not present

## 2021-09-13 LAB — VITAMIN B12: Vitamin B-12: 616 pg/mL (ref 211–911)

## 2021-09-13 NOTE — Patient Instructions (Addendum)
It was a pleasure to see you today at our office.   Recommendations:  MRI of the brain, the radiology office will call you to arrange you appointment EEG  Check labs today Follow up in 1 month   Whom to call:  Memory  decline, memory medications: Call our office [phone]   For psychiatric meds, mood meds: Please have your primary care physician manage these medications.   Counseling regarding caregiver distress, including caregiver depression, anxiety and issues regarding community resources, adult day care programs, adult living facilities, or memory care questions:   Feel free to contact Misty Lisabeth Register, Social Worker at [phone]   For assessment of decision of mental capacity and competency:  Call Dr. Erick Blinks, geriatric psychiatrist at [phone]  For guidance in geriatric dementia issues please call [facility] [phone]  For guidance regarding [facility] and if placement were needed at the facility, contact Sidney Ace, Social Worker tel: [phone]  If you have any severe symptoms of a stroke, or other severe issues such as confusion,severe chills or fever, etc call 911 or go to the ER as you may need to be evaluated further   Feel free to visit Facebook page " [email or web address]" for tips of how to care for people with memory problems.        RECOMMENDATIONS FOR ALL PATIENTS WITH MEMORY PROBLEMS: 1. Continue to exercise (Recommend 30 minutes of walking everyday, or 3 hours every week) 2. Increase social interactions - continue going to [place] and enjoy social gatherings with friends and family 3. Eat healthy, avoid fried foods and eat more fruits and vegetables 4. Maintain adequate blood pressure, blood sugar, and blood cholesterol level. Reducing the risk of stroke and cardiovascular disease also helps promoting better memory. 5. Avoid stressful situations. Live a simple life and avoid aggravations. Organize your time and  prepare for the next day in anticipation. 6. Sleep well, avoid any interruptions of sleep and avoid any distractions in the bedroom that may interfere with adequate sleep quality 7. Avoid sugar, avoid sweets as there is a strong link between excessive sugar intake, diabetes, and cognitive impairment We discussed the Mediterranean diet, which has been shown to help patients reduce the risk of progressive memory disorders and reduces cardiovascular risk. This includes eating fish, eat fruits and green leafy vegetables, nuts like almonds and hazelnuts, walnuts, and also use olive oil. Avoid fast foods and fried foods as much as possible. Avoid sweets and sugar as sugar use has been linked to worsening of memory function.  There is always a concern of gradual progression of memory problems. If this is the case, then we may need to adjust level of care according to patient needs. Support, both to the patient and caregiver, should then be put into place.      FALL PRECAUTIONS: Be cautious when walking. Scan the area for obstacles that may increase the risk of trips and falls. When getting up in the mornings, sit up at the edge of the bed for a few minutes before getting out of bed. Consider elevating the bed at the head end to avoid drop of blood pressure when getting up. Walk always in a well-lit room (use night lights in the walls). Avoid area rugs or power cords from appliances in the middle of the walkways. Use a walker or a cane if necessary and consider physical therapy for balance exercise. Get your eyesight checked regularly.  FINANCIAL OVERSIGHT: Supervision, especially oversight when making  financial decisions or transactions is also recommended.  HOME SAFETY: Consider the safety of the kitchen when operating appliances like stoves, microwave oven, and blender. Consider having supervision and share cooking responsibilities until no longer able to participate in those. Accidents with firearms and  other hazards in the house should be identified and addressed as well.   ABILITY TO BE LEFT ALONE: If patient is unable to contact 911 operator, consider using LifeLine, or when the need is there, arrange for someone to stay with patients. Smoking is a fire hazard, consider supervision or cessation. Risk of wandering should be assessed by caregiver and if detected at any point, supervision and safe proof recommendations should be instituted.  MEDICATION SUPERVISION: Inability to self-administer medication needs to be constantly addressed. Implement a mechanism to ensure safe administration of the medications.   DRIVING: Regarding driving, in patients with progressive memory problems, driving will be impaired. We advise to have someone else do the driving if trouble finding directions or if minor accidents are reported. Independent driving assessment is available to determine safety of driving.   If you are interested in the driving assessment, you can contact the following:  [facility] [employer] [facility] in [place] [phone]  [facility] [phone]  [facility] [phone] or [phone]    Mediterranean Diet A Mediterranean diet refers to food and lifestyle choices that are based on the traditions of countries located on the [employer]. This way of eating has been shown to help prevent certain conditions and improve outcomes for people who have chronic diseases, like kidney disease and heart disease. What are tips for following this plan? Lifestyle  Cook and eat meals together with your family, when possible. Drink enough fluid to keep your urine clear or pale yellow. Be physically active every day. This includes: Aerobic exercise like running or swimming. Leisure activities like gardening, walking, or housework. Get 7-8 hours of sleep each night. If recommended by your health care provider, drink red wine in  moderation. This means 1 glass a day for nonpregnant women and 2 glasses a day for men. A glass of wine equals 5 oz (150 mL). Reading food labels  Check the serving size of packaged foods. For foods such as rice and pasta, the serving size refers to the amount of cooked product, not dry. Check the total fat in packaged foods. Avoid foods that have saturated fat or trans fats. Check the ingredients list for added sugars, such as corn syrup. Shopping  At the grocery store, buy most of your food from the areas near the walls of the store. This includes: Fresh fruits and vegetables (produce). Grains, beans, nuts, and seeds. Some of these may be available in unpackaged forms or large amounts (in bulk). Fresh seafood. Poultry and eggs. Low-fat dairy products. Buy whole ingredients instead of prepackaged foods. Buy fresh fruits and vegetables in-season from local farmers markets. Buy frozen fruits and vegetables in resealable bags. If you do not have access to quality fresh seafood, buy precooked frozen shrimp or canned fish, such as tuna, salmon, or sardines. Buy small amounts of raw or cooked vegetables, salads, or olives from the deli or salad bar at your store. Stock your pantry so you always have certain foods on hand, such as olive oil, canned tuna, canned tomatoes, rice, pasta, and beans. Cooking  Cook foods with extra-virgin olive oil instead of using butter or other vegetable oils. Have meat as a side dish, and have vegetables  or grains as your main dish. This means having meat in small portions or adding small amounts of meat to foods like pasta or stew. Use beans or vegetables instead of meat in common dishes like chili or lasagna. Experiment with different cooking methods. Try roasting or broiling vegetables instead of steaming or sauteing them. Add frozen vegetables to soups, stews, pasta, or rice. Add nuts or seeds for added healthy fat at each meal. You can add these to yogurt,  salads, or vegetable dishes. Marinate fish or vegetables using olive oil, lemon juice, garlic, and fresh herbs. Meal planning  Plan to eat 1 vegetarian meal one day each week. Try to work up to 2 vegetarian meals, if possible. Eat seafood 2 or more times a week. Have healthy snacks readily available, such as: Vegetable sticks with hummus. Greek yogurt. Fruit and nut trail mix. Eat balanced meals throughout the week. This includes: Fruit: 2-3 servings a day Vegetables: 4-5 servings a day Low-fat dairy: 2 servings a day Fish, poultry, or lean meat: 1 serving a day Beans and legumes: 2 or more servings a week Nuts and seeds: 1-2 servings a day Whole grains: 6-8 servings a day Extra-virgin olive oil: 3-4 servings a day Limit red meat and sweets to only a few servings a month What are my food choices? Mediterranean diet Recommended Grains: Whole-grain pasta. Brown rice. Bulgar wheat. Polenta. Couscous. Whole-wheat bread. Orpah Cobb. Vegetables: Artichokes. Beets. Broccoli. Cabbage. Carrots. Eggplant. Green beans. Chard. Kale. Spinach. Onions. Leeks. Peas. Squash. Tomatoes. Peppers. Radishes. Fruits: Apples. Apricots. Avocado. Berries. Bananas. Cherries. Dates. Figs. Grapes. Lemons. Melon. Oranges. Peaches. Plums. Pomegranate. Meats and other protein foods: Beans. Almonds. Sunflower seeds. Pine nuts. Peanuts. Cod. Salmon. Scallops. Shrimp. Tuna. Tilapia. Clams. Oysters. Eggs. Dairy: Low-fat milk. Cheese. Greek yogurt. Beverages: Water. Red wine. Herbal tea. Fats and oils: Extra virgin olive oil. Avocado oil. Grape seed oil. Sweets and desserts: [place] yogurt with honey. Baked apples. Poached pears. Trail mix. Seasoning and other foods: Basil. Cilantro. Coriander. Cumin. Mint. Parsley. Sage. Rosemary. Tarragon. Garlic. Oregano. Thyme. Pepper. Balsalmic vinegar. Tahini. Hummus. Tomato sauce. Olives. Mushrooms. Limit these Grains: Prepackaged pasta or rice dishes. Prepackaged cereal with  added sugar. Vegetables: Deep fried potatoes (french fries). Fruits: Fruit canned in syrup. Meats and other protein foods: Beef. Pork. Lamb. Poultry with skin. Hot dogs. Tomasa Blase. Dairy: Ice cream. Sour cream. Whole milk. Beverages: Juice. Sugar-sweetened soft drinks. Beer. Liquor and spirits. Fats and oils: Butter. Canola oil. Vegetable oil. Beef fat (tallow). Lard. Sweets and desserts: Cookies. Cakes. Pies. Candy. Seasoning and other foods: Mayonnaise. Premade sauces and marinades. The items listed may not be a complete list. Talk with your dietitian about what dietary choices are right for you. Summary The Mediterranean diet includes both food and lifestyle choices. Eat a variety of fresh fruits and vegetables, beans, nuts, seeds, and whole grains. Limit the amount of red meat and sweets that you eat. Talk with your health care provider about whether it is safe for you to drink red wine in moderation. This means 1 glass a day for nonpregnant women and 2 glasses a day for men. A glass of wine equals 5 oz (150 mL). This information is not intended to replace advice given to you by your health care provider. Make sure you discuss any questions you have with your health care provider. Document Released: 08/25/2015 Document Revised: 09/27/2015 Document Reviewed: 08/25/2015 Elsevier Interactive Patient Education  2017 [employer].   We have sent a referral to [facility]  for your MRI and they will call you directly to schedule your appointment. They are located at [address] [facility]. If you need to contact them directly please call [phone].   Your provider has requested that you have labwork completed today. Please go to [facility] ([phone]) on the second floor of this building before leaving the office today. You do not need to check in. If you are not called within 15 minutes please check with the front desk.

## 2021-09-13 NOTE — Progress Notes (Cosign Needed)
Assessment/Plan:    The patient is seen in neurologic consultation at the request of Cherie Ouch, PA-C for the evaluation of memory.  Angie Robinson is a very pleasant 61 y.o. year old RH female with  a history of hypertension, hyperlipidemia, anxiety, depression, ADD, early menopause (age 89) seen today for evaluation of memory loss. MoCA today is 26 /30. Symptoms concerning for mild cognitive impairment. Work-up is in progress  Mild cognitive impairment MRI brain without contrast to assess for underlying structural abnormality and assess vascular load   Check B12 EEG (history of seizures at age 89) Folllow up in 1 month to go over the results  Subjective:    The patient is accompanied by  who supplements the history.    How long did patient have memory difficulties?  About 2 years ago, the patient was evaluated by neurology with same complaints, however no abnormalities were found.  Over the last year, she reports that her memory is worse.  Initially she attributed to age, but she states that "my memory keeps getting worse.  ".  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  "The problem is that sometimes I am writing a document, and when I had to reread it I become scatterbrained ".  Sometimes she is in the meeting, and she cannot remember that a conversation took place, at that she "was there ".   Patient lives with: Patient lives with her husband, and 2 other daughters living in the same property with their children.  She is very active all day.   Repeats oneself?  Endorsed, for the last 6 months Disoriented when walking into a room?  She has been using the phone more frequently.   leaving objects in unusual places?  Not in unusual places, but she does lose the phone all the time. Ambulates  with difficulty?   Patient denies   Recent falls?  Patient denies   Any head  injuries?  At age 43 she had a fracture of her school on the left parietal and temporal area.  At the time, she was hit by a horse.  No loss of consciousness.  History of seizures?   Age 33 due to hypoglycemia    Wandering behavior?  Patient denies   Patient drives?   No issues with driving Any mood changes such irritability agitation?  Patient denies  Any history of depression and anxiety ?:Endorsed Hallucinations?  Patient denies   Paranoia?  Patient denies   Patient reports that sleeps well without vivid dreams, REM behavior or sleepwalking  .  Occasionally she experiences some insomnia. History of sleep apnea?  Patient denies   Any hygiene concerns?  Patient denies   Independent of bathing and dressing?  Endorsed  Does the patient needs help with medications? Patient denies   Who is in charge of the finances?  Patient is in charge   Any changes in appetite? Increased   Patient denies   Patient have trouble swallowing? Patient denies   Does the patient cook?  Patient denies   Any kitchen accidents such as leaving the stove on? Patient denies   Any headaches? As a teen tll the 30s  Double vision? Patient denies   Any focal numbness or tingling?  Patient denies   Chronic back pain Patient denies   Unilateral weakness?  Patient denies   Any tremors?  Patient denies   Any history of anosmia?  Endorsed since COVID 3 years ago Any incontinence of urine?  Patient denies  stress  Any bowel dysfunction?   Patient denies   History of heavy alcohol intake?  Patient denies   History of heavy tobacco use?  Patient denies   Family history of dementia?   Fa and PGM dementia     Screen was broken, fix that, he frustrated could not find the word  I knnow the def but not the word   Allergies  Allergen Reactions   Other     gluten   Ivp Dye [Iodinated Contrast Media] Rash    Current Outpatient Medications  Medication Instructions   amphetamine-dextroamphetamine (ADDERALL) 10 MG tablet 5  mg, Oral, 2 times daily with breakfast and lunch   [START ON 10/16/2021] amphetamine-dextroamphetamine (ADDERALL) 10 MG tablet 5 mg, Oral, 2 times daily with breakfast and lunch   [START ON 09/17/2021] amphetamine-dextroamphetamine (ADDERALL) 10 MG tablet 5 mg, Oral, 2 times daily with breakfast and lunch   buPROPion (WELLBUTRIN XL) 150 mg, Oral, Every morning   Calcium Carb-Cholecalciferol 600-10 MG-MCG TABS 1 tablet   calcium carbonate (TUMS - DOSED IN MG ELEMENTAL CALCIUM) 500 MG chewable tablet 1 tablet, Oral, Daily   Cholecalciferol (VITAMIN D3) 25 MCG (1000 UT) CAPS Oral, Daily   denosumab (PROLIA) 60 MG/ML SOSY injection See admin instructions   denosumab (PROLIA) 60 mg, Subcutaneous, Every 6 months   escitalopram (LEXAPRO) 10 MG tablet 1 tablet   escitalopram (LEXAPRO) 10 mg, Oral, Daily   fluticasone (FLONASE) 50 MCG/ACT nasal spray 1 spray in each nostril   HYDROcodone-acetaminophen (NORCO/VICODIN) 5-325 MG tablet 1 tablets, every 8 hours, by mouth,  as needed for pain, for 5 days.   HYDROcodone-acetaminophen (NORCO/VICODIN) 5-325 MG tablet 1 tablet, 3 times daily PRN   hydrOXYzine (ATARAX) 25 MG tablet TAKE 1 TABLET BY MOUTH EVERY 8 Hrs PRN   levothyroxine (SYNTHROID) 25 mcg, Oral, Daily before breakfast   montelukast (SINGULAIR) 10 MG tablet 1 tablet, Oral, Daily   montelukast (SINGULAIR) 10 mg, Oral, Daily   Multiple Vitamin (MULTIVITAMINS PO) No dose, route, or frequency recorded.   valACYclovir (VALTREX) 1000 MG tablet TAKE AS DIRECTED AS NEEDED   Vitamin D (Ergocalciferol) (DRISDOL) 50,000 Units, Oral, Every 7 days     VITALS:   Vitals:   09/13/21 1317  BP: 117/73  Pulse: 63  Resp: 18  SpO2: 95%  Weight: 149 lb (67.6 kg)  Height: 5\' 2"  (1.575 m)      07/19/2021    9:44 AM  Depression screen PHQ 2/9  Decreased Interest   Down, Depressed, Hopeless   PHQ - 2 Score      Information is confidential and restricted. Go to Review Flowsheets to unlock data.    PHYSICAL  EXAM   HEENT:  Normocephalic, atraumatic. The mucous membranes are moist. The superficial temporal arteries are without ropiness or tenderness. Cardiovascular: Regular rate and rhythm. Lungs: Clear to auscultation bilaterally. Neck: There are no carotid bruits noted bilaterally.  NEUROLOGICAL:     No data to display             07/17/2021    5:17 PM 03/26/2019   10:07 AM  MMSE - Mini Mental State Exam  Orientation to time  5  Orientation to Place  5  Registration  3  Attention/ Calculation  5  Recall  2  Language- name 2 objects  2  Language- repeat  1  Language- follow 3 step command  3  Language- read & follow direction  1  Write a sentence  1  Copy design  1  Total score  29     Information is confidential and restricted. Go to Review Flowsheets to unlock data.     Orientation:  Alert and oriented to person, place and time. No aphasia or dysarthria. Fund of knowledge is appropriate. Recent memory impaired and remote memory intact.  Attention and concentration are normal.  Able to name objects and repeat phrases. Delayed recall 4/5 but fluency is impaired she was able to produce only 3 words.  L   Cranial nerves: There is good facial symmetry. Extraocular muscles are intact and visual fields are full to confrontational testing. Speech is fluent and clear. Soft palate rises symmetrically and there is no tongue deviation. Hearing is intact to conversational tone. Tone: Tone is good throughout. Sensation: Sensation is intact to light touch and pinprick throughout. Vibration is intact at the bilateral big toe.There is no extinction with double simultaneous stimulation. There is no sensory dermatomal level identified. Coordination: The patient has no difficulty with RAM's or FNF bilaterally. Normal finger to nose  Motor: Strength is 5/5 in the bilateral upper and lower extremities. There is no pronator drift. There are no fasciculations noted. DTR's: Deep tendon reflexes are 2/4 at  the bilateral biceps, triceps, brachioradialis, patella and achilles.  Plantar responses are downgoing bilaterally. Gait and Station: The patient is able to ambulate without difficulty.The patient is able to heel toe walk without any difficulty.The patient is able to ambulate in a tandem fashion. The patient is able to stand in the Romberg position.     Thank you for allowing [place] the opportunity to participate in the care of this nice patient. Please do not hesitate to contact us for any questions or concerns.   Total time spent on today's visit was 50  minutes dedicated to this patient today, preparing to see patient, examining the patient, ordering tests and/or medications and counseling the patient, documenting clinical information in the EHR or other health record, independently interpreting results and communicating results to the patient/family, discussing treatment and goals, answering patient's questions and coordinating care.  Cc:  Merri Brunette, MD  Marlowe Kays 09/13/2021 1:29 PM

## 2021-09-13 NOTE — Progress Notes (Signed)
B12 levels are normal, thanks

## 2021-09-28 ENCOUNTER — Ambulatory Visit
Admission: RE | Admit: 2021-09-28 | Discharge: 2021-09-28 | Disposition: A | Discharge status: Home / Self Care | Payer: BLUE CROSS/BLUE SHIELD | Source: Ambulatory Visit | Attending: Physician Assistant | Admitting: Physician Assistant

## 2021-10-16 ENCOUNTER — Ambulatory Visit: Payer: BLUE CROSS/BLUE SHIELD | Admitting: Physician Assistant

## 2021-10-16 DIAGNOSIS — R413 Other amnesia: Secondary | ICD-10-CM

## 2021-10-16 NOTE — Patient Instructions (Signed)
It was a pleasure to see you today at our office.   Recommendations:  Continue ADD medication  Consider Psychotherapy in addition to Psychiatry, ? Hypnosis to get to the root of the cause of memory difficulties. COnsider also relaxation techniques   Whom to call:  Memory  decline, memory medications: Call our office [phone]   For psychiatric meds, mood meds: Please have your primary care physician manage these medications.   Counseling regarding caregiver distress, including caregiver depression, anxiety and issues regarding community resources, adult day care programs, adult living facilities, or memory care questions:   Feel free to contact Misty Lisabeth Register, Social Worker at [phone]   For assessment of decision of mental capacity and competency:  Call Dr. Erick Blinks, geriatric psychiatrist at [phone]  For guidance in geriatric dementia issues please call [facility] [phone]  For guidance regarding [facility] and if placement were needed at the facility, contact Sidney Ace, Social Worker tel: [phone]  If you have any severe symptoms of a stroke, or other severe issues such as confusion,severe chills or fever, etc call 911 or go to the ER as you may need to be evaluated further   Feel free to visit Facebook page " [email or web address]" for tips of how to care for people with memory problems.        RECOMMENDATIONS FOR ALL PATIENTS WITH MEMORY PROBLEMS: 1. Continue to exercise (Recommend 30 minutes of walking everyday, or 3 hours every week) 2. Increase social interactions - continue going to [place] and enjoy social gatherings with friends and family 3. Eat healthy, avoid fried foods and eat more fruits and vegetables 4. Maintain adequate blood pressure, blood sugar, and blood cholesterol level. Reducing the risk of stroke and cardiovascular disease also helps promoting better memory. 5. Avoid stressful situations. Live a simple  life and avoid aggravations. Organize your time and prepare for the next day in anticipation. 6. Sleep well, avoid any interruptions of sleep and avoid any distractions in the bedroom that may interfere with adequate sleep quality 7. Avoid sugar, avoid sweets as there is a strong link between excessive sugar intake, diabetes, and cognitive impairment We discussed the Mediterranean diet, which has been shown to help patients reduce the risk of progressive memory disorders and reduces cardiovascular risk. This includes eating fish, eat fruits and green leafy vegetables, nuts like almonds and hazelnuts, walnuts, and also use olive oil. Avoid fast foods and fried foods as much as possible. Avoid sweets and sugar as sugar use has been linked to worsening of memory function.  There is always a concern of gradual progression of memory problems. If this is the case, then we may need to adjust level of care according to patient needs. Support, both to the patient and caregiver, should then be put into place.      FALL PRECAUTIONS: Be cautious when walking. Scan the area for obstacles that may increase the risk of trips and falls. When getting up in the mornings, sit up at the edge of the bed for a few minutes before getting out of bed. Consider elevating the bed at the head end to avoid drop of blood pressure when getting up. Walk always in a well-lit room (use night lights in the walls). Avoid area rugs or power cords from appliances in the middle of the walkways. Use a walker or a cane if necessary and consider physical therapy for balance exercise. Get your eyesight checked regularly.  FINANCIAL OVERSIGHT: Supervision, especially  oversight when making financial decisions or transactions is also recommended.  HOME SAFETY: Consider the safety of the kitchen when operating appliances like stoves, microwave oven, and blender. Consider having supervision and share cooking responsibilities until no longer able to  participate in those. Accidents with firearms and other hazards in the house should be identified and addressed as well.   ABILITY TO BE LEFT ALONE: If patient is unable to contact 911 operator, consider using LifeLine, or when the need is there, arrange for someone to stay with patients. Smoking is a fire hazard, consider supervision or cessation. Risk of wandering should be assessed by caregiver and if detected at any point, supervision and safe proof recommendations should be instituted.  MEDICATION SUPERVISION: Inability to self-administer medication needs to be constantly addressed. Implement a mechanism to ensure safe administration of the medications.   DRIVING: Regarding driving, in patients with progressive memory problems, driving will be impaired. We advise to have someone else do the driving if trouble finding directions or if minor accidents are reported. Independent driving assessment is available to determine safety of driving.   If you are interested in the driving assessment, you can contact the following:  [employer] in [place]  [place] [phone] or [phone]    [place] refers to food and lifestyle choices that are based on the traditions of countries located on the [employer]. This way of eating has been shown to help prevent certain conditions and improve outcomes for people who have chronic diseases, like kidney disease and heart disease. What are tips for following this plan? Lifestyle  Cook and eat meals together with your family, when possible. Drink enough fluid to keep your urine clear or pale yellow. Be physically active every day. This includes: Aerobic exercise like running or swimming. Leisure activities like gardening, walking, or housework. Get 7-8 hours of sleep each night. If recommended  by your health care provider, drink red wine in moderation. This means 1 glass a day for nonpregnant women and 2 glasses a day for men. A glass of wine equals 5 oz (150 mL). Reading food labels  Check the serving size of packaged foods. For foods such as rice and pasta, the serving size refers to the amount of cooked product, not dry. Check the total fat in packaged foods. Avoid foods that have saturated fat or trans fats. Check the ingredients list for added sugars, such as corn syrup. Shopping  At the grocery store, buy most of your food from the areas near the walls of the store. This includes: Fresh fruits and vegetables (produce). Grains, beans, nuts, and seeds. Some of these may be available in unpackaged forms or large amounts (in bulk). Fresh seafood. Poultry and eggs. Low-fat dairy products. Buy whole ingredients instead of prepackaged foods. Buy fresh fruits and vegetables in-season from local farmers markets. Buy frozen fruits and vegetables in resealable bags. If you do not have access to quality fresh seafood, buy precooked frozen shrimp or canned fish, such as tuna, salmon, or sardines. Buy small amounts of raw or cooked vegetables, salads, or olives from the deli or salad bar at your store. Stock your pantry so you always have certain foods on hand, such as olive oil, canned tuna, canned tomatoes, rice, pasta, and beans. Cooking  Cook foods with extra-virgin olive oil instead of using butter or other vegetable oils. Have meat as a side dish,  and have vegetables or grains as your main dish. This means having meat in small portions or adding small amounts of meat to foods like pasta or stew. Use beans or vegetables instead of meat in common dishes like chili or lasagna. Experiment with different cooking methods. Try roasting or broiling vegetables instead of steaming or sauteing them. Add frozen vegetables to soups, stews, pasta, or rice. Add nuts or seeds for added healthy fat  at each meal. You can add these to yogurt, salads, or vegetable dishes. Marinate fish or vegetables using olive oil, lemon juice, garlic, and fresh herbs. Meal planning  Plan to eat 1 vegetarian meal one day each week. Try to work up to 2 vegetarian meals, if possible. Eat seafood 2 or more times a week. Have healthy snacks readily available, such as: Vegetable sticks with hummus. Greek yogurt. Fruit and nut trail mix. Eat balanced meals throughout the week. This includes: Fruit: 2-3 servings a day Vegetables: 4-5 servings a day Low-fat dairy: 2 servings a day Fish, poultry, or lean meat: 1 serving a day Beans and legumes: 2 or more servings a week Nuts and seeds: 1-2 servings a day Whole grains: 6-8 servings a day Extra-virgin olive oil: 3-4 servings a day Limit red meat and sweets to only a few servings a month What are my food choices? Mediterranean diet Recommended Grains: Whole-grain pasta. Brown rice. Bulgar wheat. Polenta. Couscous. Whole-wheat bread. Orpah Cobb. Vegetables: Artichokes. Beets. Broccoli. Cabbage. Carrots. Eggplant. Green beans. Chard. Kale. Spinach. Onions. Leeks. Peas. Squash. Tomatoes. Peppers. Radishes. Fruits: Apples. Apricots. Avocado. Berries. Bananas. Cherries. Dates. Figs. Grapes. Lemons. Melon. Oranges. Peaches. Plums. Pomegranate. Meats and other protein foods: Beans. Almonds. Sunflower seeds. Pine nuts. Peanuts. Cod. Salmon. Scallops. Shrimp. Tuna. Tilapia. Clams. Oysters. Eggs. Dairy: Low-fat milk. Cheese. Greek yogurt. Beverages: Water. Red wine. Herbal tea. Fats and oils: Extra virgin olive oil. Avocado oil. Grape seed oil. Sweets and desserts: [place] yogurt with honey. Baked apples. Poached pears. Trail mix. Seasoning and other foods: Basil. Cilantro. Coriander. Cumin. Mint. Parsley. Sage. Rosemary. Tarragon. Garlic. Oregano. Thyme. Pepper. Balsalmic vinegar. Tahini. Hummus. Tomato sauce. Olives. Mushrooms. Limit these Grains: Prepackaged  pasta or rice dishes. Prepackaged cereal with added sugar. Vegetables: Deep fried potatoes (french fries). Fruits: Fruit canned in syrup. Meats and other protein foods: Beef. Pork. Lamb. Poultry with skin. Hot dogs. Tomasa Blase. Dairy: Ice cream. Sour cream. Whole milk. Beverages: Juice. Sugar-sweetened soft drinks. Beer. Liquor and spirits. Fats and oils: Butter. Canola oil. Vegetable oil. Beef fat (tallow). Lard. Sweets and desserts: Cookies. Cakes. Pies. Candy. Seasoning and other foods: Mayonnaise. Premade sauces and marinades. The items listed may not be a complete list. Talk with your dietitian about what dietary choices are right for you. Summary The Mediterranean diet includes both food and lifestyle choices. Eat a variety of fresh fruits and vegetables, beans, nuts, seeds, and whole grains. Limit the amount of red meat and sweets that you eat. Talk with your health care provider about whether it is safe for you to drink red wine in moderation. This means 1 glass a day for nonpregnant women and 2 glasses a day for men. A glass of wine equals 5 oz (150 mL). This information is not intended to replace advice given to you by your health care provider. Make sure you discuss any questions you have with your health care provider. Document Released: 08/25/2015 Document Revised: 09/27/2015 Document Reviewed: 08/25/2015 Elsevier Interactive Patient Education  2017 [employer].   We have sent a referral  to [facility] for your MRI and they will call you directly to schedule your appointment. They are located at [address] [facility]. If you need to contact them directly please call [phone].   Your provider has requested that you have labwork completed today. Please go to [facility] ([id number]) on the second floor of this building before leaving the office today. You do not need to check in. If you are not called within 15 minutes please check with the front desk.

## 2021-10-16 NOTE — Progress Notes (Signed)
Assessment/Plan:   Memory difficulties   Angie Robinson is a very pleasant 61 y.o. RH female  with  a history of hypertension, hyperlipidemia, anxiety, depression, ADD, early menopause (age 5) seen today for evaluation of memory loss seen today in follow up to discuss the MRI results. These were personally reviewed, and the structure, size and vascular load are completely normal to explain her symptoms .  There are no neurological findings to suspect dementia.  At this point, ADD/ADHD, depression,anxiety may be a contributing factor to her memory difficulties.  Patient is not on psychotherapy, although it is highly recommended.  Last MoCA was 26/30     Follow up as needed Continue to control mood as per Psychiatry Consider psychotherapy.      Subjective:    This patient is accompanied in the office by  who supplements the history.  Previous records as well as any outside records available were reviewed prior to todays visit. MoCA on 08/2021 was 26/30    Any changes in memory since last visit?  She feels that her memory needs to "calm down, she has difficulty with focusing ".  She still continues to describe her memory changes as "scatterbrained "    Initial visit 09/13/2021 How long did patient have memory difficulties?  About 2 years ago, the patient was evaluated by neurology with same complaints, however no abnormalities were found.  Over the last year, she reports that her memory is worse.  Initially she attributed to age, but she states that "my memory keeps getting worse.  ".  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  "The problem is that sometimes I am writing a document, and when I had to reread it I become scatterbrained ".  Sometimes she is in the meeting, and she cannot remember that a conversation took place, at that she "was there ".   Patient lives with:  Patient lives with her husband, and 2 other daughters living in the same property with their children.  She is very active all day.   Repeats oneself?  Endorsed, for the last 6 months Disoriented when walking into a room?  She has been using the phone more frequently.   leaving objects in unusual places?  Not in unusual places, but she does lose the phone all the time. Ambulates  with difficulty?   Patient denies   Recent falls?  Patient denies   Any head injuries?  At age 53 she had a fracture of her school on the left parietal and temporal area.  At the time, she was hit by a horse.  No loss of consciousness.  History of seizures?   Age 68 due to hypoglycemia    Wandering behavior?  Patient denies   Patient drives?   No issues with driving Any mood changes such irritability agitation?  Patient denies  Any history of depression and anxiety ?:Endorsed Hallucinations?  Patient denies   Paranoia?  Patient denies   Patient reports that sleeps well without vivid dreams, REM behavior or sleepwalking  .  Occasionally she experiences some insomnia. History of sleep apnea?  Patient denies   Any hygiene concerns?  Patient denies   Independent of bathing and dressing?  Endorsed  Does the patient needs help with medications? Patient denies   Who is in charge of the finances?  Patient is in charge   Any  changes in appetite? Increased   Patient denies   Patient have trouble swallowing? Patient denies   Does the patient cook?  Patient denies   Any kitchen accidents such as leaving the stove on? Patient denies   Any headaches? As a teen till the 25s  Double vision? Patient denies   Any focal numbness or tingling?  Patient denies   Chronic back pain Patient denies   Unilateral weakness?  Patient denies   Any tremors?  Patient denies   Any history of anosmia?  Endorsed since [place] 3 years ago Any incontinence of urine?  Patient denies  stress  Any bowel dysfunction?   Patient denies   History of  heavy alcohol intake?  Patient denies   History of heavy tobacco use?  Patient denies   Family history of dementia?   Fa and PGM dementia         CURRENT MEDICATIONS:  Outpatient Encounter Medications as of 10/16/2021  Medication Sig   amphetamine-dextroamphetamine (ADDERALL) 10 MG tablet Take 0.5 tablets (5 mg total) by mouth 2 (two) times daily with breakfast and lunch.   amphetamine-dextroamphetamine (ADDERALL) 10 MG tablet Take 0.5 tablets (5 mg total) by mouth 2 (two) times daily with breakfast and lunch. (Patient not taking: Reported on 09/13/2021)   amphetamine-dextroamphetamine (ADDERALL) 10 MG tablet Take 0.5 tablets (5 mg total) by mouth 2 (two) times daily with breakfast and lunch. (Patient not taking: Reported on 09/13/2021)   buPROPion (WELLBUTRIN XL) 150 MG 24 hr tablet Take 150 mg by mouth every morning.   Calcium Carb-Cholecalciferol 600-10 MG-MCG TABS 1 tablet   calcium carbonate (TUMS - DOSED IN MG ELEMENTAL CALCIUM) 500 MG chewable tablet Chew 1 tablet by mouth daily.   Cholecalciferol (VITAMIN D3) 25 MCG (1000 UT) CAPS Take by mouth daily.   denosumab (PROLIA) 60 MG/ML SOSY injection Inject 60 mg into the skin every 6 (six) months.   denosumab (PROLIA) 60 MG/ML SOSY injection See admin instructions. (Patient not taking: Reported on 09/13/2021)   escitalopram (LEXAPRO) 10 MG tablet Take 10 mg by mouth daily.   escitalopram (LEXAPRO) 10 MG tablet 1 tablet   fluticasone (FLONASE) 50 MCG/ACT nasal spray 1 spray in each nostril   HYDROcodone-acetaminophen (NORCO/VICODIN) 5-325 MG tablet 1 tablets, every 8 hours, by mouth,  as needed for pain, for 5 days. (Patient not taking: Reported on 09/13/2021)   HYDROcodone-acetaminophen (NORCO/VICODIN) 5-325 MG tablet Take 1 tablet by mouth 3 (three) times daily as needed. (Patient not taking: Reported on 09/13/2021)   hydrOXYzine (ATARAX) 25 MG tablet TAKE 1 TABLET BY MOUTH EVERY 8 Hrs PRN   levothyroxine (SYNTHROID) 25 MCG tablet Take 25 mcg  by mouth daily before breakfast.   montelukast (SINGULAIR) 10 MG tablet Take 10 mg by mouth daily.   montelukast (SINGULAIR) 10 MG tablet Take 1 tablet by mouth daily.   Multiple Vitamin (MULTIVITAMINS PO)    valACYclovir (VALTREX) 1000 MG tablet TAKE AS DIRECTED AS NEEDED   Vitamin D, Ergocalciferol, (DRISDOL) 1.25 MG (50000 UNIT) CAPS capsule Take 1 capsule (50,000 Units total) by mouth every 7 (seven) days.   No facility-administered encounter medications on file as of 10/16/2021.       07/17/2021    5:17 PM 03/26/2019   10:07 AM  MMSE - Mini Mental State Exam  Orientation to time  5  Orientation to Place  5  Registration  3  Attention/ Calculation  5  Recall  2  Language- name 2 objects  2  Language- repeat  1  Language- follow 3 step command  3  Language- read & follow direction  1  Write a sentence  1  Copy design  1  Total score  29     Information is confidential and restricted. Go to Review Flowsheets to unlock data.      09/21/2021    3:21 PM  Montreal Cognitive Assessment   Visuospatial/ Executive (0/5) 4  Naming (0/3) 3  Attention: Read list of digits (0/2) 2  Attention: Read list of letters (0/1) 1  Attention: Serial 7 subtraction starting at 100 (0/3) 3  Language: Repeat phrase (0/2) 2  Language : Fluency (0/1) 0  Abstraction (0/2) 1  Delayed Recall (0/5) 4  Orientation (0/6) 6  Total 26   Thank you for allowing [place] the opportunity to participate in the care of this nice patient. Please do not hesitate to contact us for any questions or concerns.   Total time spent on today's visit was 26 minutes dedicated to this patient today, preparing to see patient, examining the patient, ordering tests and/or medications and counseling the patient, documenting clinical information in the EHR or other health record, independently interpreting results and communicating results to the patient/family, discussing treatment and goals, answering patient's questions and  coordinating care.  Cc:  Merri Brunette, MD  Marlowe Kays 10/16/2021 7:53 AM

## 2021-10-18 ENCOUNTER — Encounter: Payer: Self-pay | Admitting: Physician Assistant

## 2021-10-18 ENCOUNTER — Ambulatory Visit (INDEPENDENT_AMBULATORY_CARE_PROVIDER_SITE_OTHER): Payer: BLUE CROSS/BLUE SHIELD | Admitting: Physician Assistant

## 2021-10-18 DIAGNOSIS — F902 Attention-deficit hyperactivity disorder, combined type: Secondary | ICD-10-CM

## 2021-10-18 DIAGNOSIS — F411 Generalized anxiety disorder: Secondary | ICD-10-CM

## 2021-10-18 DIAGNOSIS — F32A Depression, unspecified: Secondary | ICD-10-CM

## 2021-10-18 DIAGNOSIS — Z87820 Personal history of traumatic brain injury: Secondary | ICD-10-CM

## 2021-10-18 DIAGNOSIS — R413 Other amnesia: Secondary | ICD-10-CM

## 2021-10-18 DIAGNOSIS — R7989 Other specified abnormal findings of blood chemistry: Secondary | ICD-10-CM

## 2021-10-18 MED ORDER — ESCITALOPRAM OXALATE 10 MG PO TABS
15.0000 mg | ORAL_TABLET | Freq: Every day | ORAL | 1 refills | Status: DC
Start: 2021-10-18 — End: 2021-12-11

## 2021-10-18 NOTE — Progress Notes (Unsigned)
[facility]  Patient ID: Angie Robinson,  MRN: [id number]  PCP: Deland Pretty, MD  Date of Evaluation: 10/18/2021 Time spent:30 minutes  Chief Complaint:  Chief Complaint   Follow-up; ADD; Depression     HISTORY/CURRENT STATUS: HPI  For routine f/u.  Saw Sharene Butters, Neuro PA a few weeks ago for memory loss.  MRI results were completely normal.  No neurologic findings to suspect dementia.  Angie Robinson feels that ADHD, depression, and/or anxiety are contributing factors to memory difficulties.  She recommended psychotherapy, and change in medications if appropriate.  Angie Robinson is here to discuss the plan going forward.  She was started on Adderall a few months ago.  It has been very helpful. States that attention is better without easy distractibility.  Able to focus on things and finish tasks to completion.  Does not feel like attention is at 100% but is better than before she started the Adderall.  She is able to enjoy things.  Energy and motivation are good.  Work is going well.   No extreme sadness, tearfulness, or feelings of hopelessness.  Sleeps well most of the time. ADLs and personal hygiene are normal.  Appetite has not changed.  She has gained some weight attributable to Lexapro but she does not feel like it is huge problem right now.  States her mood is better so gaining a few pounds has been worth it.  Not isolating.  Not crying easily.  When she cannot think of a word or remember things then that makes her anxious, feels on edge and uneasy.  Unfortunately this is often during the day.  The changes in medications denies suicidal or homicidal thoughts.  Patient denies increased energy with decreased need for sleep, increased talkativeness, racing thoughts, impulsivity or risky behaviors, increased spending, increased libido, grandiosity, increased irritability or anger, paranoia, or hallucinations.  Denies dizziness, syncope, seizures, numbness, tingling, tremor, tics,  unsteady gait, slurred speech, confusion. Denies muscle or joint pain, stiffness, or dystonia.  Individual Medical History/ Review of Systems: Changes? :Yes   see neurology note  Past Psychiatric History:    H/o anxiety, depression    Past medications for mental health diagnoses include: Lexapro has helped anxiety, Zoloft for a long time but quit working, took Wellbutrin for about a month at some point for wt issues from SSRI   H/O of TBI.  Cracked her skull after a horse ran over her, had bleeding, frontal and occipital, didn't need surgery, the bleeding finally resolved. Lost her memory for awhile.   Allergies: Other and Ivp dye [iodinated contrast media]  Current Medications:  Current Outpatient Medications:    amphetamine-dextroamphetamine (ADDERALL) 10 MG tablet, Take 0.5 tablets (5 mg total) by mouth 2 (two) times daily with breakfast and lunch., Disp: 30 tablet, Rfl: 0   amphetamine-dextroamphetamine (ADDERALL) 10 MG tablet, Take 0.5 tablets (5 mg total) by mouth 2 (two) times daily with breakfast and lunch., Disp: 30 tablet, Rfl: 0   amphetamine-dextroamphetamine (ADDERALL) 10 MG tablet, Take 0.5 tablets (5 mg total) by mouth 2 (two) times daily with breakfast and lunch., Disp: 30 tablet, Rfl: 0   Calcium Carb-Cholecalciferol 600-10 MG-MCG TABS, 1 tablet, Disp: , Rfl:    calcium carbonate (TUMS - DOSED IN MG ELEMENTAL CALCIUM) 500 MG chewable tablet, Chew 1 tablet by mouth daily., Disp: , Rfl:    Cholecalciferol (VITAMIN D3) 25 MCG (1000 UT) CAPS, Take by mouth daily., Disp: , Rfl:    denosumab (PROLIA) 60 MG/ML SOSY injection, Inject  60 mg into the skin every 6 (six) months., Disp: , Rfl:    denosumab (PROLIA) 60 MG/ML SOSY injection, See admin instructions., Disp: , Rfl:    hydrOXYzine (ATARAX) 25 MG tablet, TAKE 1 TABLET BY MOUTH EVERY 8 Hrs PRN, Disp: , Rfl:    levothyroxine (SYNTHROID) 25 MCG tablet, Take 25 mcg by mouth daily before breakfast., Disp: , Rfl:    montelukast  (SINGULAIR) 10 MG tablet, Take 10 mg by mouth daily., Disp: , Rfl:    montelukast (SINGULAIR) 10 MG tablet, Take 1 tablet by mouth daily., Disp: , Rfl:    valACYclovir (VALTREX) 1000 MG tablet, TAKE AS DIRECTED AS NEEDED, Disp: , Rfl:    escitalopram (LEXAPRO) 10 MG tablet, Take 1.5 tablets (15 mg total) by mouth daily., Disp: 45 tablet, Rfl: 1   Multiple Vitamin (MULTIVITAMINS PO), , Disp: , Rfl:  Medication Side Effects: none  Family Medical/ Social History: Changes?  See HPI  MENTAL HEALTH EXAM:  There were no vitals taken for this visit.There is no height or weight on file to calculate BMI.  General Appearance: Casual and Well Groomed  Eye Contact:  Good  Speech:  Clear and Coherent and Normal Rate  Volume:  Normal  Mood:  Anxious  Affect:  Congruent  Thought Process:  Goal Directed and Descriptions of Associations: Circumstantial  Orientation:  Full (Time, Place, and Person)  Thought Content: Logical   Suicidal Thoughts:  No  Homicidal Thoughts:  No  Memory:  Immediate;   Good Recent;   Fair  Judgement:  Good  Insight:  Good  Psychomotor Activity:  Normal  Concentration:  Concentration: Good and Attention Span: Good  Recall:  Good  Fund of Knowledge: Good  Language: Good  Assets:  Desire for Improvement  ADL's:  Intact  Cognition: WNL  Prognosis:  Good   DIAGNOSES:    ICD-10-CM   1. Generalized anxiety disorder  F41.1     2. Attention deficit hyperactivity disorder (ADHD), combined type  F90.2     3. Mild depression  F32.A     4. Low vitamin D level  R79.89     5. History of traumatic brain injury  Z87.820     6. Memory difficulties  R41.3       Receiving Psychotherapy: No   RECOMMENDATIONS:  PDMP reviewed.  Adderall filled 09/20/2021. I provided 30 minutes of face to face time during this encounter, including time spent before and after the visit in records review, medical decision making, counseling pertinent to today's visit, and charting.   We  discussed options to help with memory.  I recommend increasing the Lexapro, which can help with anxiety.  I do not see signs of depression at this point but the anxiety is not controlled.  She would like to increase it.  We will continue Adderall at the same dose.  I will discuss having neuropsychological testing done with Sharene Butters, neurology PA-C.  That will be helpful on my end with direction of treatment, for example needing more of a stimulant, anxiety control, or change in treatment for depression.  We also discussed counseling.  I am referring to Emily Filbert, [facility] here in our office.  Continue Adderall 10 mg, 1/2 pill at breakfast, 1/2 pill at lunch. Increase Lexapro 10 mg to 1.5 pills daily. Start hydroxyzine 25 mg, 1 p.o. every 8 hours as needed. Continue multivitamin and calcium daily.  Also OTC vitamin D. Needs to have vitamin D level drawn.  She has  the order. Return in 4 to 6 weeks.  Donnal Moat, PA-C

## 2021-10-19 NOTE — Progress Notes (Signed)
[facility]  Patient ID: Angie Robinson,  MRN: [id number]  PCP: Deland Pretty, MD  Date of Evaluation: 10/18/2021 Time spent:30 minutes  Chief Complaint:  Chief Complaint   Follow-up; ADD; Depression     HISTORY/CURRENT STATUS: HPI  For routine f/u.  Saw Sharene Butters, Neuro PA a few weeks ago for memory loss.  MRI results were completely normal.  No neurologic findings to suspect dementia.  Angie Robinson feels that ADHD, depression, and/or anxiety are contributing factors to memory difficulties.  She recommended psychotherapy, and change in medications if appropriate.  Angie Robinson is here to discuss the plan going forward.  She was started on Adderall a few months ago.  It has been very helpful. States that attention is better without easy distractibility.  Able to focus on things and finish tasks to completion.  Does not feel like attention is at 100% but is better than before she started the Adderall.  She is able to enjoy things.  Energy and motivation are good.  Work is going well.   No extreme sadness, tearfulness, or feelings of hopelessness.  Sleeps well most of the time. ADLs and personal hygiene are normal.  Appetite has not changed.  She has gained some weight attributable to Lexapro but she does not feel like it is huge problem right now.  States her mood is better so gaining a few pounds has been worth it.  Not isolating.  Not crying easily.  When she cannot think of a word or remember things then that makes her anxious, feels on edge and uneasy.  Unfortunately this is often during the day.  The changes in medications denies suicidal or homicidal thoughts.  Patient denies increased energy with decreased need for sleep, increased talkativeness, racing thoughts, impulsivity or risky behaviors, increased spending, increased libido, grandiosity, increased irritability or anger, paranoia, or hallucinations.  Denies dizziness, syncope, seizures, numbness, tingling, tremor, tics,  unsteady gait, slurred speech, confusion. Denies muscle or joint pain, stiffness, or dystonia.  Individual Medical History/ Review of Systems: Changes? :Yes   see neurology note  Past Psychiatric History:    H/o anxiety, depression    Past medications for mental health diagnoses include: Lexapro has helped anxiety, Zoloft for a long time but quit working, took Wellbutrin for about a month at some point for wt issues from SSRI   H/O of TBI.  Cracked her skull after a horse ran over her, had bleeding, frontal and occipital, didn't need surgery, the bleeding finally resolved. Lost her memory for awhile.   Allergies: Other and Ivp dye [iodinated contrast media]  Current Medications:  Current Outpatient Medications:    amphetamine-dextroamphetamine (ADDERALL) 10 MG tablet, Take 0.5 tablets (5 mg total) by mouth 2 (two) times daily with breakfast and lunch., Disp: 30 tablet, Rfl: 0   amphetamine-dextroamphetamine (ADDERALL) 10 MG tablet, Take 0.5 tablets (5 mg total) by mouth 2 (two) times daily with breakfast and lunch., Disp: 30 tablet, Rfl: 0   amphetamine-dextroamphetamine (ADDERALL) 10 MG tablet, Take 0.5 tablets (5 mg total) by mouth 2 (two) times daily with breakfast and lunch., Disp: 30 tablet, Rfl: 0   Calcium Carb-Cholecalciferol 600-10 MG-MCG TABS, 1 tablet, Disp: , Rfl:    calcium carbonate (TUMS - DOSED IN MG ELEMENTAL CALCIUM) 500 MG chewable tablet, Chew 1 tablet by mouth daily., Disp: , Rfl:    Cholecalciferol (VITAMIN D3) 25 MCG (1000 UT) CAPS, Take by mouth daily., Disp: , Rfl:    denosumab (PROLIA) 60 MG/ML SOSY injection, Inject  60 mg into the skin every 6 (six) months., Disp: , Rfl:    denosumab (PROLIA) 60 MG/ML SOSY injection, See admin instructions., Disp: , Rfl:    hydrOXYzine (ATARAX) 25 MG tablet, TAKE 1 TABLET BY MOUTH EVERY 8 Hrs PRN, Disp: , Rfl:    levothyroxine (SYNTHROID) 25 MCG tablet, Take 25 mcg by mouth daily before breakfast., Disp: , Rfl:    montelukast  (SINGULAIR) 10 MG tablet, Take 10 mg by mouth daily., Disp: , Rfl:    montelukast (SINGULAIR) 10 MG tablet, Take 1 tablet by mouth daily., Disp: , Rfl:    valACYclovir (VALTREX) 1000 MG tablet, TAKE AS DIRECTED AS NEEDED, Disp: , Rfl:    escitalopram (LEXAPRO) 10 MG tablet, Take 1.5 tablets (15 mg total) by mouth daily., Disp: 45 tablet, Rfl: 1   Multiple Vitamin (MULTIVITAMINS PO), , Disp: , Rfl:  Medication Side Effects: none  Family Medical/ Social History: Changes?  See HPI  MENTAL HEALTH EXAM:  There were no vitals taken for this visit.There is no height or weight on file to calculate BMI.  General Appearance: Casual and Well Groomed  Eye Contact:  Good  Speech:  Clear and Coherent and Normal Rate  Volume:  Normal  Mood:  Anxious  Affect:  Congruent  Thought Process:  Goal Directed and Descriptions of Associations: Circumstantial  Orientation:  Full (Time, Place, and Person)  Thought Content: Logical   Suicidal Thoughts:  No  Homicidal Thoughts:  No  Memory:  Immediate;   Good Recent;   Fair  Judgement:  Good  Insight:  Good  Psychomotor Activity:  Normal  Concentration:  Concentration: Good and Attention Span: Good  Recall:  Good  Fund of Knowledge: Good  Language: Good  Assets:  Desire for Improvement  ADL's:  Intact  Cognition: WNL  Prognosis:  Good   DIAGNOSES:    ICD-10-CM   1. Generalized anxiety disorder  F41.1     2. Attention deficit hyperactivity disorder (ADHD), combined type  F90.2     3. Mild depression  F32.A     4. Low vitamin D level  R79.89     5. History of traumatic brain injury  Z87.820     6. Memory difficulties  R41.3       Receiving Psychotherapy: No   RECOMMENDATIONS:  PDMP reviewed.  Adderall filled 09/20/2021. I provided 30 minutes of face to face time during this encounter, including time spent before and after the visit in records review, medical decision making, counseling pertinent to today's visit, and charting.   We  discussed options to help with memory.  I recommend increasing the Lexapro, which can help with anxiety.  I do not see signs of depression at this point but the anxiety is not controlled.  She would like to increase it.  We will continue Adderall at the same dose.  I will discuss having neuropsychological testing done with Sharene Butters, neurology PA-C.  That will be helpful on my end with direction of treatment, for example needing more of a stimulant, anxiety control, or change in treatment for depression.  We also discussed counseling.  I am referring to Emily Filbert, [facility] here in our office.  Continue Adderall 10 mg, 1/2 pill at breakfast, 1/2 pill at lunch. Increase Lexapro 10 mg to 1.5 pills daily. Start hydroxyzine 25 mg, 1 p.o. every 8 hours as needed. Continue multivitamin and calcium daily.  Also OTC vitamin D. Needs to have vitamin D level drawn.  She has  the order. Return in 4 to 6 weeks.  Donnal Moat, PA-C

## 2021-10-21 ENCOUNTER — Telehealth: Payer: Self-pay | Admitting: Physician Assistant

## 2021-10-21 NOTE — Telephone Encounter (Signed)
Please let her know I've tried to get a neuropsychologist appt set up at [facility], where she recently saw Sharene Butters, [place]. They don't do neuropsych testing on anyone under 59 years old. I asked for an exception but they said no.  Neuropsychologists aren't easy to find. I have had patients in the past see Dr. Alphonzo Severance, who I respect. He was in [place], but is now in [place], in the [facility]. Would she like me to refer her there? Or if her daughters (who are NPs) or PCP, Thedora Hinders NP, have other suggestions, let me know and I'll refer her there.  If she's not taking B Complex, add that in. Also gingko biloba may be beneficial. Thanks.

## 2021-10-24 NOTE — Telephone Encounter (Signed)
Patient notified about neuropsychologist. Patient will ask her daughters if they know of one. Recommendations given for B-complex and gingko biloba.

## 2021-10-25 ENCOUNTER — Ambulatory Visit (HOSPITAL_COMMUNITY): Payer: BLUE CROSS/BLUE SHIELD

## 2021-11-15 ENCOUNTER — Ambulatory Visit: Payer: BLUE CROSS/BLUE SHIELD | Admitting: Physician Assistant

## 2021-12-11 ENCOUNTER — Encounter: Payer: Self-pay | Admitting: Physician Assistant

## 2021-12-11 ENCOUNTER — Ambulatory Visit (INDEPENDENT_AMBULATORY_CARE_PROVIDER_SITE_OTHER): Payer: BLUE CROSS/BLUE SHIELD | Admitting: Physician Assistant

## 2021-12-11 DIAGNOSIS — Z87820 Personal history of traumatic brain injury: Secondary | ICD-10-CM | POA: Diagnosis not present

## 2021-12-11 DIAGNOSIS — F902 Attention-deficit hyperactivity disorder, combined type: Secondary | ICD-10-CM

## 2021-12-11 DIAGNOSIS — F411 Generalized anxiety disorder: Secondary | ICD-10-CM

## 2021-12-11 DIAGNOSIS — R7989 Other specified abnormal findings of blood chemistry: Secondary | ICD-10-CM

## 2021-12-11 MED ORDER — AMPHETAMINE-DEXTROAMPHETAMINE 10 MG PO TABS: 5.0000 mg | ORAL_TABLET | Freq: Two times a day (BID) | ORAL | 0 refills | Status: DC

## 2021-12-11 MED ORDER — ESCITALOPRAM OXALATE 10 MG PO TABS: 15.0000 mg | ORAL_TABLET | Freq: Every day | ORAL | 3 refills | Status: DC

## 2021-12-11 NOTE — Progress Notes (Signed)
[facility]  Patient ID: Angie Robinson,  MRN: [id number]  PCP: Merri Brunette, MD  Date of Evaluation: 12/11/2021 Time spent:20 minutes  Chief Complaint:  Chief Complaint   Anxiety; Depression; Follow-up    HISTORY/CURRENT STATUS: HPI  For routine f/u.  Under a lot of stress at home, one of her daughters is in the middle of a divorce which is hard on every body.  Work is stressful but no worse than normal.  She is not missing days.  She feels like her medications are finally in a good place.  The only problem is weight gain with the Lexapro, even though she is not eating very much.  She probably needs to lose about 30 pounds.  She wants to discuss this with her PCP.  She took Wellbutrin at some point during the past but does not remember if it helped or not with mood or weight or what ever.  Energy and motivation are good.  ADLs and personal hygiene are normal.  Appetite is normal.  Sleeps well.  No suicidal or homicidal thoughts.  She is feeling much better as far as her memory goes.  She still forgets things like what she went into her room for for example but it is not a huge problem now.  She is grateful for that.  Anxiety is much better.  The increase of Lexapro helped a lot.  Patient denies increased energy with decreased need for sleep, increased talkativeness, racing thoughts, impulsivity or risky behaviors, increased spending, increased libido, grandiosity, increased irritability or anger, paranoia, or hallucinations.  Denies dizziness, syncope, seizures, numbness, tingling, tremor, tics, unsteady gait, slurred speech, confusion. Denies muscle or joint pain, stiffness, or dystonia.  Individual Medical History/ Review of Systems: Changes? :No    Past Psychiatric History:    H/o anxiety, depression    Past medications for mental health diagnoses include: Lexapro has helped anxiety, Zoloft for a long time but quit working, took Wellbutrin for about a month at  some point for wt issues from SSRI (she does not remember if the Wellbutrin helped with weight or not.)   H/O of TBI.  Cracked her skull after a horse ran over her, had bleeding, frontal and occipital, didn't need surgery, the bleeding finally resolved. Lost her memory for awhile.   Allergies: Other and Ivp dye [iodinated contrast media]  Current Medications:  Current Outpatient Medications:    Calcium Carb-Cholecalciferol 600-10 MG-MCG TABS, 1 tablet, Disp: , Rfl:    calcium carbonate (TUMS - DOSED IN MG ELEMENTAL CALCIUM) 500 MG chewable tablet, Chew 1 tablet by mouth daily., Disp: , Rfl:    Cholecalciferol (VITAMIN D3) 25 MCG (1000 UT) CAPS, Take by mouth daily., Disp: , Rfl:    denosumab (PROLIA) 60 MG/ML SOSY injection, Inject 60 mg into the skin every 6 (six) months., Disp: , Rfl:    denosumab (PROLIA) 60 MG/ML SOSY injection, See admin instructions., Disp: , Rfl:    levothyroxine (SYNTHROID) 25 MCG tablet, Take 25 mcg by mouth daily before breakfast., Disp: , Rfl:    valACYclovir (VALTREX) 1000 MG tablet, TAKE AS DIRECTED AS NEEDED, Disp: , Rfl:    amphetamine-dextroamphetamine (ADDERALL) 10 MG tablet, Take 0.5 tablets (5 mg total) by mouth 2 (two) times daily with breakfast and lunch., Disp: 30 tablet, Rfl: 0   [START ON 01/09/2022] amphetamine-dextroamphetamine (ADDERALL) 10 MG tablet, Take 0.5 tablets (5 mg total) by mouth 2 (two) times daily with breakfast and lunch., Disp: 30 tablet, Rfl: 0   [  START ON 02/08/2022] amphetamine-dextroamphetamine (ADDERALL) 10 MG tablet, Take 0.5 tablets (5 mg total) by mouth 2 (two) times daily with breakfast and lunch., Disp: 30 tablet, Rfl: 0   escitalopram (LEXAPRO) 10 MG tablet, Take 1.5 tablets (15 mg total) by mouth daily., Disp: 135 tablet, Rfl: 3   hydrOXYzine (ATARAX) 25 MG tablet, TAKE 1 TABLET BY MOUTH EVERY 8 Hrs PRN (Patient not taking: Reported on 12/11/2021), Disp: , Rfl:    montelukast (SINGULAIR) 10 MG tablet, Take 10 mg by mouth daily.  (Patient not taking: Reported on 12/11/2021), Disp: , Rfl:    montelukast (SINGULAIR) 10 MG tablet, Take 1 tablet by mouth daily. (Patient not taking: Reported on 12/11/2021), Disp: , Rfl:    Multiple Vitamin (MULTIVITAMINS PO), , Disp: , Rfl:  Medication Side Effects: none  Family Medical/ Social History: Changes?  See HPI  MENTAL HEALTH EXAM:  There were no vitals taken for this visit.There is no height or weight on file to calculate BMI.  General Appearance: Casual and Well Groomed  Eye Contact:  Good  Speech:  Clear and Coherent and Normal Rate  Volume:  Normal  Mood:  Euthymic  Affect:  Congruent  Thought Process:  Goal Directed and Descriptions of Associations: Circumstantial  Orientation:  Full (Time, Place, and Person)  Thought Content: Logical   Suicidal Thoughts:  No  Homicidal Thoughts:  No  Memory:  Immediate;   Good Recent;   Good  Judgement:  Good  Insight:  Good  Psychomotor Activity:  Normal  Concentration:  Concentration: Good and Attention Span: Good  Recall:  Good  Fund of Knowledge: Good  Language: Good  Assets:  Desire for Improvement  ADL's:  Intact  Cognition: WNL  Prognosis:  Good   DIAGNOSES:    ICD-10-CM   1. Attention deficit hyperactivity disorder (ADHD), combined type  F90.2     2. Generalized anxiety disorder  F41.1     3. History of traumatic brain injury  Z87.820     4. Low vitamin D level  R79.89       Receiving Psychotherapy: No   RECOMMENDATIONS:  PDMP reviewed.  Adderall filled 10/27/2021. I provided 20 minutes of face to face time during this encounter, including time spent before and after the visit in records review, medical decision making, counseling pertinent to today's visit, and charting.   She is doing well as far as her mental health goes.  Her memory is better as well which is great.  We agreed to make no changes in medications at this time. Consider retrying wellbutrin, if unable to get ozempic or monjouro.  She  would like to discuss 1 of these injections with her PCP first.  Continue Adderall 10 mg, 1/2 pill at breakfast, 1/2 pill at lunch. Continue Lexapro 10 mg, 1.5 pills daily.  Continue multivitamin and calcium daily.  Also OTC vitamin D. Return in 6 months.Melony Overly, PA-C

## 2022-01-01 ENCOUNTER — Ambulatory Visit (HOSPITAL_BASED_OUTPATIENT_CLINIC_OR_DEPARTMENT_OTHER): Payer: BLUE CROSS/BLUE SHIELD | Admitting: Pulmonary Disease

## 2022-01-01 ENCOUNTER — Encounter (HOSPITAL_BASED_OUTPATIENT_CLINIC_OR_DEPARTMENT_OTHER): Payer: Self-pay | Admitting: Pulmonary Disease

## 2022-01-01 VITALS — BP 116/80 | HR 71 | Ht 62.0 in | Wt 149.0 lb

## 2022-01-01 DIAGNOSIS — R053 Chronic cough: Secondary | ICD-10-CM | POA: Insufficient documentation

## 2022-01-01 MED ORDER — BREO ELLIPTA 200-25 MCG/ACT IN AEPB: 1.0000 | INHALATION_SPRAY | Freq: Every day | RESPIRATORY_TRACT | 1 refills | Status: DC

## 2022-01-01 NOTE — Progress Notes (Signed)
Subjective:   PATIENT ID: Angie Robinson GENDER: female DOB: Apr 25, 1960, MRN: [id number]  Chief Complaint  Patient presents with   Consult    Cough since sept tried different over the counter meds that hasn't helped    Reason for Visit: New consult for chronic cough  Angie Robinson is a 61 year old female never smoker with childhood asthma, allergic rhinitis, hypothyroidism, depression, osteoporosis who presents as new consult for chronic cough.  She reports cough for >3 months that began in September. She has had productive cough with mainly clear sputum. Prednisone will help it. She reports new illness x 2 in this time period. Coughing causes her to have headache. Small blood clot yesterday. Has tried mucinex, nyquil without relief. Sometimes the cough will wake her up at night.  Previously received allergy shots for asthma. She is currently on Breo two weeks ago. Not consistently taking it. Currently on singulair. Not currently using albuterol.   Since COVID in winter in 2020, she has had shortness of breath that limits her ability to run. She feels like she will breath very hard for no reason. Before this she would run 1-1.5 miles and now only 1/2 mile. Denies wheezing. Denies reflux.  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  Past Medical History:  Diagnosis Date   Anxiety    COVID-19 12/2018   Depression    Hypothyroid    Memory loss    Osteoporosis    Vitamin D deficiency      Family History  Problem Relation Age of Onset   Depression Mother    Ovarian cancer Mother    Dementia Father    Depression Sister    Anxiety disorder Brother    Depression Brother    Depression Maternal Grandfather    Dementia Maternal Grandmother    Alcohol abuse Paternal Grandfather    Dementia Paternal Grandmother    Anxiety disorder Daughter     Anxiety disorder Daughter    Anxiety disorder Daughter      Social History   Occupational History    Comment: [employer]  Tobacco Use   Smoking status: Never   Smokeless tobacco: Never  Vaping Use   Vaping Use: Never used  Substance and Sexual Activity   Alcohol use: Not Currently   Drug use: Never   Sexual activity: Not on file    Allergies  Allergen Reactions   Other     gluten   Ivp Dye [Iodinated Contrast Media] Rash     Outpatient Medications Prior to Visit  Medication Sig Dispense Refill   calcium carbonate (TUMS - DOSED IN MG ELEMENTAL CALCIUM) 500 MG chewable tablet Chew 1 tablet by mouth daily.     Cholecalciferol (VITAMIN D3) 25 MCG (1000 UT) CAPS Take by mouth daily.     denosumab (PROLIA) 60 MG/ML SOSY injection See admin instructions.     escitalopram (LEXAPRO) 10 MG tablet Take 1.5 tablets (15 mg total) by mouth daily. 135 tablet 3   levothyroxine (SYNTHROID) 25 MCG tablet Take 25 mcg by mouth daily before breakfast.     valACYclovir (VALTREX) 1000 MG tablet TAKE AS DIRECTED AS NEEDED     BREO ELLIPTA 50-25 MCG/INH AEPB Inhale into the lungs.     amphetamine-dextroamphetamine (ADDERALL) 10 MG tablet Take 0.5 tablets (5 mg total) by mouth 2 (two) times daily with breakfast and lunch. (Patient not taking:  Reported on 01/01/2022) 30 tablet 0   hydrOXYzine (ATARAX) 25 MG tablet TAKE 1 TABLET BY MOUTH EVERY 8 Hrs PRN (Patient not taking: Reported on 12/11/2021)     montelukast (SINGULAIR) 10 MG tablet Take 10 mg by mouth daily. (Patient not taking: Reported on 12/11/2021)     Multiple Vitamin (MULTIVITAMINS PO)  (Patient not taking: Reported on 10/18/2021)     [START ON 01/09/2022] amphetamine-dextroamphetamine (ADDERALL) 10 MG tablet Take 0.5 tablets (5 mg total) by mouth 2 (two) times daily with breakfast and lunch. 30 tablet 0   [START ON 02/08/2022] amphetamine-dextroamphetamine (ADDERALL) 10 MG tablet Take 0.5 tablets (5 mg total) by mouth 2 (two) times daily with  breakfast and lunch. 30 tablet 0   Calcium Carb-Cholecalciferol 600-10 MG-MCG TABS 1 tablet     denosumab (PROLIA) 60 MG/ML SOSY injection Inject 60 mg into the skin every 6 (six) months.     montelukast (SINGULAIR) 10 MG tablet Take 1 tablet by mouth daily. (Patient not taking: Reported on 12/11/2021)     No facility-administered medications prior to visit.    Review of Systems  Constitutional:  Negative for chills, diaphoresis, fever, malaise/fatigue and weight loss.  HENT:  Positive for congestion.   Respiratory:  Positive for cough, sputum production and shortness of breath. Negative for hemoptysis and wheezing.   Cardiovascular:  Negative for chest pain, palpitations and leg swelling.     Objective:   Vitals:   01/01/22 1532  BP: 116/80  Pulse: 71  SpO2: 99%  Weight: 149 lb (67.6 kg)  Height: 5\' 2"  (1.575 m)   SpO2: 99 % O2 Device: None (Room air)  Physical Exam: General: Well-appearing, no acute distress HENT: [place], AT Eyes: EOMI, no scleral icterus Respiratory: Clear to auscultation bilaterally.  No crackles, wheezing or rales Cardiovascular: RRR, -M/R/G, no JVD Extremities:-Edema,-tenderness Neuro: AAO x4, CNII-XII grossly intact Psych: Normal mood, normal affect  Data Reviewed:  Imaging: CT Cardiac 06/09/21 - Visualized lung parenchyma with no pulmonary nodules, masses, infiltrate, effusion or pneumothorax.  PFT: None on file  Labs: CBC    Component Value Date/Time   WBC 3.6 03/26/2019 1118   RBC 4.43 03/26/2019 1118   HGB 14.0 03/26/2019 1118   HCT 42.5 03/26/2019 1118   MCV 96 03/26/2019 1118   MCH 31.6 03/26/2019 1118   MCHC 32.9 03/26/2019 1118   RDW 12.4 03/26/2019 1118   LYMPHSABS 1.5 03/26/2019 1118   EOSABS 0.1 03/26/2019 1118   BASOSABS 0.0 03/26/2019 1118   Absolute eos 03/26/19 - 100     Assessment & Plan:   Discussion: 61 year old female never smoker with childhood asthma, allergic rhinitis, hypothyroidism, depression, osteoporosis  who presents as new consult for chronic cough. Common causes of cough were discussed including upper airway cough syndrome, reflux and undiagnosed obstructive lung disease. We reviewed evaluation and management for cough as noted below.  Chronic cough --ORDER pulmonary function tests --INCREASE Breo 200-50 ONE puff ONCE a day --START omeprazole 40 mg once a day. Purchase over-the-counter --START flonase 1 spray per nostril at night   Health Maintenance Immunization History  Administered Date(s) Administered   PFIZER(Purple Top)SARS-COV-2 Vaccination 05/07/2019, 06/01/2019   CT Lung Screen - never smoker. Not qualified  No orders of the defined types were placed in this encounter.  Meds ordered this encounter  Medications   fluticasone furoate-vilanterol (BREO ELLIPTA) 200-25 MCG/ACT AEPB    Sig: Inhale 1 puff into the lungs daily.    Dispense:  180 each  Refill:  1    No follow-ups on file. After PFTs  I have spent a total time of 45-minutes on the day of the appointment reviewing prior documentation, coordinating care and discussing medical diagnosis and plan with the patient/family. Imaging, labs and tests included in this note have been reviewed and interpreted independently by me.  Jeidy Hoerner Mechele Collin, MD [facility] 01/01/2022 4:15 PM  Office Number [phone]

## 2022-01-01 NOTE — Addendum Note (Signed)
Addended by: Geraldo Docker on: 01/01/2022 04:25 PM   Modules accepted: Orders

## 2022-01-01 NOTE — Patient Instructions (Addendum)
Chronic cough --ORDER pulmonary function tests --INCREASE Breo 200-50 ONE puff ONCE a day --START omeprazole 40 mg once a day. Purchase over-the-counter --START flonase 1 spray per nostril at night  Follow-up with me after PFTs. This week or January, per patient preference

## 2022-01-02 ENCOUNTER — Encounter (HOSPITAL_BASED_OUTPATIENT_CLINIC_OR_DEPARTMENT_OTHER): Payer: BLUE CROSS/BLUE SHIELD

## 2022-01-17 ENCOUNTER — Encounter (HOSPITAL_BASED_OUTPATIENT_CLINIC_OR_DEPARTMENT_OTHER): Payer: Self-pay | Admitting: Pulmonary Disease

## 2022-01-17 ENCOUNTER — Ambulatory Visit (INDEPENDENT_AMBULATORY_CARE_PROVIDER_SITE_OTHER): Payer: BLUE CROSS/BLUE SHIELD | Admitting: Pulmonary Disease

## 2022-01-17 ENCOUNTER — Ambulatory Visit (HOSPITAL_BASED_OUTPATIENT_CLINIC_OR_DEPARTMENT_OTHER): Payer: BLUE CROSS/BLUE SHIELD | Admitting: Pulmonary Disease

## 2022-01-17 VITALS — BP 118/68 | HR 74 | Wt 150.4 lb

## 2022-01-17 DIAGNOSIS — J45991 Cough variant asthma: Secondary | ICD-10-CM | POA: Diagnosis not present

## 2022-01-17 DIAGNOSIS — R053 Chronic cough: Secondary | ICD-10-CM

## 2022-01-17 LAB — PULMONARY FUNCTION TEST
DL/VA % pred: 104 %
DL/VA: 4.49 ml/min/mmHg/L
DLCO cor % pred: 101 %
DLCO cor: 19.16 ml/min/mmHg
DLCO unc % pred: 101 %
DLCO unc: 19.16 ml/min/mmHg
FEF 25-75 Post: 3.21 L/sec
FEF 25-75 Pre: 2.82 L/sec
FEF2575-%Change-Post: 13 %
FEF2575-%Pred-Post: 140 %
FEF2575-%Pred-Pre: 123 %
FEV1-%Change-Post: 3 %
FEV1-%Pred-Post: 105 %
FEV1-%Pred-Pre: 102 %
FEV1-Post: 2.54 L
FEV1-Pre: 2.45 L
FEV1FVC-%Change-Post: -2 %
FEV1FVC-%Pred-Pre: 106 %
FEV6-%Change-Post: 4 %
FEV6-%Pred-Post: 102 %
FEV6-%Pred-Pre: 98 %
FEV6-Post: 3.06 L
FEV6-Pre: 2.93 L
FEV6FVC-%Change-Post: -1 %
FEV6FVC-%Pred-Post: 102 %
FEV6FVC-%Pred-Pre: 103 %
FVC-%Change-Post: 5 %
FVC-%Pred-Post: 100 %
FVC-%Pred-Pre: 94 %
FVC-Post: 3.1 L
FVC-Pre: 2.93 L
Post FEV1/FVC ratio: 82 %
Post FEV6/FVC ratio: 99 %
Pre FEV1/FVC ratio: 84 %
Pre FEV6/FVC Ratio: 100 %
RV % pred: 82 %
RV: 1.53 L
TLC % pred: 96 %
TLC: 4.6 L

## 2022-01-17 NOTE — Patient Instructions (Signed)
Full PFT Performed Today  

## 2022-01-17 NOTE — Patient Instructions (Signed)
Cough variant asthma --Reviewed PFTs. Overall normal with partial response to inhalers --CONTINUE Breo 200-50 mcg ONE puff ONCE a day --STOP omeprazole --Plan to reduce Breo to every other day at the beginning of March. Please call our office if you need any refills  Follow-up with me with me in October 2024

## 2022-01-17 NOTE — Progress Notes (Signed)
Full PFT Performed Today  

## 2022-01-17 NOTE — Progress Notes (Signed)
Subjective:   PATIENT ID: Angie Robinson GENDER: female DOB: 01-20-1960, MRN: [id number]  Chief Complaint  Patient presents with   Follow-up    Wants to know if she needs to continue prolosec     Reason for Visit: F/u chronic cough  [name] is a 62 year old female never smoker with childhood asthma, allergic rhinitis, hypothyroidism, depression, osteoporosis who presents for follow-up  Initial consult/01/01/22 She reports cough for >3 months that began in September. She has had productive cough with mainly clear sputum. Prednisone will help it. She reports new illness x 2 in this time period. Coughing causes her to have headache. Small blood clot yesterday. Has tried mucinex, nyquil without relief. Sometimes the cough will wake her up at night.  Previously received allergy shots for asthma. She is currently on Breo two weeks ago. Not consistently taking it. Currently on singulair. Not currently using albuterol.   Since COVID in winter in 2020, she has had shortness of breath that limits her ability to run. She feels like she will breath very hard for no reason. Before this she would run 1-1.5 miles and now only 1/2 mile. Denies wheezing. Denies reflux.  01/17/21 Since our last visit we increased her Breo and started on flonase and omeprazole. Cough has completely resolved. Congestion improved flonase. Not feeling any benefit with omeprazole since our last visit two weeks ago.  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  Past Medical History:  Diagnosis Date   Anxiety    COVID-19 12/2018   Depression    Hypothyroid    Memory loss    Osteoporosis    Vitamin D deficiency      Family History  Problem Relation Age of Onset   Depression Mother    Ovarian cancer Mother    Dementia Father    Depression Sister    Anxiety disorder Brother    Depression Brother    Depression Maternal Grandfather    Dementia Maternal Grandmother    Alcohol  abuse Paternal Grandfather    Dementia Paternal Grandmother    Anxiety disorder Daughter    Anxiety disorder Daughter    Anxiety disorder Daughter      Social History   Occupational History    Comment: [employer]  Tobacco Use   Smoking status: Never   Smokeless tobacco: Never  Vaping Use   Vaping Use: Never used  Substance and Sexual Activity   Alcohol use: Not Currently   Drug use: Never   Sexual activity: Not on file    Allergies  Allergen Reactions   Other     gluten   Ivp Dye [Iodinated Contrast Media] Rash     Outpatient Medications Prior to Visit  Medication Sig Dispense Refill   calcium carbonate (TUMS - DOSED IN MG ELEMENTAL CALCIUM) 500 MG chewable tablet Chew 1 tablet by mouth daily.     Cholecalciferol (VITAMIN D3) 25 MCG (1000 UT) CAPS Take by mouth daily.     denosumab (PROLIA) 60 MG/ML SOSY injection See admin instructions.     escitalopram (LEXAPRO) 10 MG tablet Take 1.5 tablets (15 mg total) by mouth daily. 135 tablet 3   fluticasone furoate-vilanterol (BREO ELLIPTA) 200-25 MCG/ACT AEPB Inhale 1 puff into the lungs daily. 180 each 1   levothyroxine (SYNTHROID) 25 MCG tablet Take 25 mcg by mouth daily before breakfast.     Multiple Vitamin (MULTIVITAMINS PO)      valACYclovir (VALTREX) 1000 MG tablet  TAKE AS DIRECTED AS NEEDED     amphetamine-dextroamphetamine (ADDERALL) 10 MG tablet Take 0.5 tablets (5 mg total) by mouth 2 (two) times daily with breakfast and lunch. (Patient not taking: Reported on 01/01/2022) 30 tablet 0   hydrOXYzine (ATARAX) 25 MG tablet TAKE 1 TABLET BY MOUTH EVERY 8 Hrs PRN (Patient not taking: Reported on 12/11/2021)     montelukast (SINGULAIR) 10 MG tablet Take 10 mg by mouth daily. (Patient not taking: Reported on 12/11/2021)     No facility-administered medications prior to visit.    Review of Systems  Constitutional:  Negative for chills, diaphoresis, fever, malaise/fatigue and weight loss.  HENT:  Negative for congestion.    Respiratory:  Negative for cough, hemoptysis, sputum production, shortness of breath and wheezing.   Cardiovascular:  Negative for chest pain, palpitations and leg swelling.     Objective:   Vitals:   01/17/22 1432  BP: 118/68  Pulse: 74  SpO2: 98%  Weight: 150 lb 6.4 oz (68.2 kg)   SpO2: 98 % O2 Device: None (Room air)  Physical Exam: General: Well-appearing, no acute distress HENT: [place], AT Eyes: EOMI, no scleral icterus Respiratory: No respiratory distress. Normal RR Neuro: AAO x4, CNII-XII grossly intact Psych: Normal mood, normal affect  Data Reviewed:  Imaging: CT Cardiac 06/09/21 - Visualized lung parenchyma with no pulmonary nodules, masses, infiltrate, effusion or pneumothorax.  PFT: 01/17/22 FVC 3.10 (100%) FEV1 2.54 (105%) Ratio 84  TLC 96% DLCO 101%. No significant bronchodilator response Interpretation: Normal PFTs   Labs: CBC    Component Value Date/Time   WBC 3.6 03/26/2019 1118   RBC 4.43 03/26/2019 1118   HGB 14.0 03/26/2019 1118   HCT 42.5 03/26/2019 1118   MCV 96 03/26/2019 1118   MCH 31.6 03/26/2019 1118   MCHC 32.9 03/26/2019 1118   RDW 12.4 03/26/2019 1118   LYMPHSABS 1.5 03/26/2019 1118   EOSABS 0.1 03/26/2019 1118   BASOSABS 0.0 03/26/2019 1118   Absolute eos 03/26/19 - 100     Assessment & Plan:   Discussion: 62 year old female never smoker with childhood asthma, allergic rhinitis, hypothyroidism, depression, osteoporosis who presents as for follow-up for chronic cough. Resolved on ICS/LABA and flonase. PFTs reviewed and normal. Given benefit of current treatment, will continue bronchodilators. Plan to trial off in the summer and if she fails, she will contact office for inhaler refill.  Cough variant asthma - improved --Reviewed PFTs. Overall normal with partial response to inhalers --CONTINUE Breo 200-50 mcg ONE puff ONCE a day --STOP omeprazole --Plan to reduce Breo to every other day at the beginning of March. Please call our  office if you need any refills   Health Maintenance Immunization History  Administered Date(s) Administered   PFIZER(Purple Top)SARS-COV-2 Vaccination 05/07/2019, 06/01/2019   CT Lung Screen - never smoker. Not qualified  No orders of the defined types were placed in this encounter.  No orders of the defined types were placed in this encounter.   Return in about 9 months (around 10/18/2022).   I have spent a total time of 35-minutes on the day of the appointment including chart review, data review, collecting history, coordinating care and discussing medical diagnosis and plan with the patient/family. Past medical history, allergies, medications were reviewed. Pertinent imaging, labs and tests included in this note have been reviewed and interpreted independently by me.  [place], MD [facility] 01/17/2022 2:59 PM  Office Number [phone]

## 2022-04-27 ENCOUNTER — Other Ambulatory Visit: Payer: Self-pay | Admitting: Physician Assistant

## 2022-04-27 ENCOUNTER — Telehealth: Payer: Self-pay | Admitting: Physician Assistant

## 2022-04-27 MED ORDER — FLUOXETINE HCL 40 MG PO CAPS: 40.0000 mg | ORAL_CAPSULE | Freq: Every day | ORAL | 1 refills | Status: DC

## 2022-04-27 NOTE — Telephone Encounter (Signed)
Patient said she stopped Zoloft because it was no longer working. She elects to switch to Prozac at this time. Does she need to wean off Lexapro before switching to Prozac?  Pharmacy - [facility] on [address]

## 2022-04-27 NOTE — Telephone Encounter (Signed)
Pt LVM @ 1:22p.  She said Rosey Bath has her on meds that are giving her problems.  She would like to know if she can change to a different med that's in the same family.  Next appt 5/28

## 2022-04-27 NOTE — Telephone Encounter (Signed)
Patient is taking Lexapro and said it is working well for her but she is always hungry and has gained 30-40 pounds. She asks if she can switch to Prozac. I know this can also have SE of increased appetite.

## 2022-04-27 NOTE — Telephone Encounter (Signed)
Prozac is a good choice, it doesn't cause quite as much wt gain as most of the other SSRIs.  So yes I can send that and.  Or does she want to retry the Zoloft?  She did very well on it for a long time, and that did not cause weight gain that I know of.  A couple of other options include Trintellix or Viibryd, but I had rather discuss with her before we switch to 1 of those.

## 2022-04-27 NOTE — Telephone Encounter (Signed)
She can stop the Lexapro and start the Prozac the next day.

## 2022-04-30 NOTE — Telephone Encounter (Signed)
Patient notified

## 2022-05-23 ENCOUNTER — Other Ambulatory Visit: Payer: Self-pay | Admitting: Physician Assistant

## 2022-05-31 ENCOUNTER — Other Ambulatory Visit: Payer: Self-pay

## 2022-05-31 MED ORDER — FLUOXETINE HCL 40 MG PO CAPS: 40.0000 mg | ORAL_CAPSULE | Freq: Every day | ORAL | 0 refills | Status: DC

## 2022-06-03 ENCOUNTER — Other Ambulatory Visit (HOSPITAL_BASED_OUTPATIENT_CLINIC_OR_DEPARTMENT_OTHER): Payer: Self-pay | Admitting: Pulmonary Disease

## 2022-06-12 ENCOUNTER — Ambulatory Visit (INDEPENDENT_AMBULATORY_CARE_PROVIDER_SITE_OTHER): Payer: BLUE CROSS/BLUE SHIELD | Admitting: Physician Assistant

## 2022-06-12 ENCOUNTER — Encounter: Payer: Self-pay | Admitting: Physician Assistant

## 2022-06-12 DIAGNOSIS — F4323 Adjustment disorder with mixed anxiety and depressed mood: Secondary | ICD-10-CM | POA: Diagnosis not present

## 2022-06-12 DIAGNOSIS — F902 Attention-deficit hyperactivity disorder, combined type: Secondary | ICD-10-CM | POA: Diagnosis not present

## 2022-06-12 MED ORDER — AMPHETAMINE-DEXTROAMPHETAMINE 10 MG PO TABS: 5.0000 mg | ORAL_TABLET | Freq: Two times a day (BID) | ORAL | 0 refills | Status: DC

## 2022-06-12 NOTE — Progress Notes (Signed)
[facility]  Patient ID: KAYA CAJAS,  MRN: [id number]  PCP: Merri Brunette, MD  Date of Evaluation: 06/12/2022 Time spent:20 minutes  Chief Complaint:  Chief Complaint   Follow-up    HISTORY/CURRENT STATUS: HPI  For routine f/u.  Changed Lexapro to Prozac since her last visit.  States it is working very well.  Patient is able to enjoy things.  Energy and motivation are good.  Work is going well.  Is under more stress, her MIL lives with her and her husband now.  No extreme sadness, tearfulness, or feelings of hopelessness.  Sleeps well most of the time. ADLs and personal hygiene are normal.  Appetite has not changed.  Weight is stable.  Anxiety is well controlled.  No panic attacks.  Denies suicidal or homicidal thoughts.  States that attention is good without easy distractibility.  Able to focus on things and finish tasks to completion.  She rarely takes the Adderall because it does cause anxiety.  Patient denies increased energy with decreased need for sleep, increased talkativeness, racing thoughts, impulsivity or risky behaviors, increased spending, increased libido, grandiosity, increased irritability or anger, paranoia, or hallucinations.  Denies dizziness, syncope, seizures, numbness, tingling, tremor, tics, unsteady gait, slurred speech, confusion. Denies muscle or joint pain, stiffness, or dystonia.  Individual Medical History/ Review of Systems: Changes? :No    Past Psychiatric History:    H/o anxiety, depression    Past medications for mental health diagnoses include: Lexapro has helped anxiety, Zoloft for a long time but quit working, took Wellbutrin for about a month at some point for wt issues from SSRI (she does not remember if the Wellbutrin helped with weight or not.) Prozac   H/O of TBI.  Cracked her skull after a horse ran over her, had bleeding, frontal and occipital, didn't need surgery, the bleeding finally resolved. Lost her memory for  awhile.   Allergies: Other and Ivp dye [iodinated contrast media]  Current Medications:  Current Outpatient Medications:    BREO ELLIPTA 200-25 MCG/ACT AEPB, USE 1 INHALATION BY MOUTH DAILY, Disp: 180 each, Rfl: 3   calcium carbonate (TUMS - DOSED IN MG ELEMENTAL CALCIUM) 500 MG chewable tablet, Chew 1 tablet by mouth daily., Disp: , Rfl:    Cholecalciferol (VITAMIN D3) 25 MCG (1000 UT) CAPS, Take by mouth daily., Disp: , Rfl:    denosumab (PROLIA) 60 MG/ML SOSY injection, See admin instructions., Disp: , Rfl:    FLUoxetine (PROZAC) 40 MG capsule, Take 1 capsule (40 mg total) by mouth daily., Disp: 90 capsule, Rfl: 0   levothyroxine (SYNTHROID) 25 MCG tablet, Take 25 mcg by mouth daily before breakfast., Disp: , Rfl:    valACYclovir (VALTREX) 1000 MG tablet, TAKE AS DIRECTED AS NEEDED, Disp: , Rfl:    amphetamine-dextroamphetamine (ADDERALL) 10 MG tablet, Take 0.5 tablets (5 mg total) by mouth 2 (two) times daily with breakfast and lunch., Disp: 30 tablet, Rfl: 0   hydrOXYzine (ATARAX) 25 MG tablet, TAKE 1 TABLET BY MOUTH EVERY 8 Hrs PRN (Patient not taking: Reported on 12/11/2021), Disp: , Rfl:    montelukast (SINGULAIR) 10 MG tablet, Take 10 mg by mouth daily. (Patient not taking: Reported on 12/11/2021), Disp: , Rfl:    Multiple Vitamin (MULTIVITAMINS PO), , Disp: , Rfl:  Medication Side Effects: none  Family Medical/ Social History: Changes?  See HPI  MENTAL HEALTH EXAM:  There were no vitals taken for this visit.There is no height or weight on file to calculate BMI.  General  Appearance: Casual and Well Groomed  Eye Contact:  Good  Speech:  Clear and Coherent and Normal Rate  Volume:  Normal  Mood:  Euthymic  Affect:  Congruent  Thought Process:  Goal Directed and Descriptions of Associations: Circumstantial  Orientation:  Full (Time, Place, and Person)  Thought Content: Logical   Suicidal Thoughts:  No  Homicidal Thoughts:  No  Memory:  Immediate;   Good Recent;   Good   Judgement:  Good  Insight:  Good  Psychomotor Activity:  Normal  Concentration:  Concentration: Good and Attention Span: Good  Recall:  Good  Fund of Knowledge: Good  Language: Good  Assets:  Desire for Improvement Financial Resources/Insurance Housing Transportation Vocational/Educational  ADL's:  Intact  Cognition: WNL  Prognosis:  Good   DIAGNOSES:    ICD-10-CM   1. Adjustment disorder with mixed anxiety and depressed mood  F43.23     2. Attention deficit hyperactivity disorder (ADHD), combined type  F90.2      Receiving Psychotherapy: No   RECOMMENDATIONS:  PDMP reviewed.  Adderall filled 12/14/2021. I provided 20 minutes of face to face time during this encounter, including time spent before and after the visit in records review, medical decision making, counseling pertinent to today's visit, and charting.   She is doing well overall so no changes will be made.  She would like to transfer care back to her PCP.  This is fine with me and she is welcome to return if she needs me in the future.  Continue Adderall 10 mg, 1/2 pill at breakfast, 1/2 pill at lunch. Continue Prozac 40 mg, 1 p.o. daily. Continue multivitamin and calcium daily.  Also OTC vitamin D. Return as needed.  Melony Overly, PA-C

## 2022-06-19 ENCOUNTER — Telehealth: Payer: Self-pay | Admitting: Physician Assistant

## 2022-06-19 NOTE — Telephone Encounter (Signed)
Patient said she had her physical at Dr. Carolee Rota office and her vitamin D level was 43. She said she thought you told her you wanted it to be above 60. She is asking if she should take prescription vitamin D again.   Reviewed date:06/12/2022 09:25:10 AM Interpretation: Performing [facility] [place], [address], [place], [place] [id number], Phone - [phone], Director - MDNagendra Notes/Report:  Vitamin D, 25-Hydroxy 43.1 30.0-100.0 ng/mL

## 2022-06-19 NOTE — Telephone Encounter (Signed)
Pt left message. Please advise what Vit D level should be? Contact # [phone]

## 2022-06-20 NOTE — Telephone Encounter (Signed)
What is her OTC dose? I think it's 1,000. If so, have her increase to 4,000 and it should be repeated in 6-8 weeks.

## 2022-06-20 NOTE — Telephone Encounter (Signed)
Patient reported taking 2000. Notified of recommendation.

## 2022-06-25 ENCOUNTER — Other Ambulatory Visit: Payer: Self-pay | Admitting: Physician Assistant

## 2022-07-18 ENCOUNTER — Other Ambulatory Visit: Payer: Self-pay | Admitting: Internal Medicine

## 2022-07-18 DIAGNOSIS — Z1231 Encounter for screening mammogram for malignant neoplasm of breast: Secondary | ICD-10-CM

## 2022-07-26 ENCOUNTER — Telehealth: Payer: Self-pay | Admitting: Physician Assistant

## 2022-07-26 NOTE — Telephone Encounter (Signed)
Please see message. Last visit 5/28.

## 2022-07-26 NOTE — Telephone Encounter (Signed)
Pt LVM @ 2:54p requesting to lower her dose of Prozac from 40mg  to 20mg .  She is working with a psychologist about her anxiety and would like to decrease the dosage of meds.  She would like a new script sent in for 20mg .  No upcoming appt scheduled.

## 2022-07-27 MED ORDER — FLUOXETINE HCL 20 MG PO CAPS: 20.0000 mg | ORAL_CAPSULE | Freq: Every day | ORAL | 2 refills | Status: DC

## 2022-07-27 NOTE — Telephone Encounter (Signed)
Rx sent per request. 

## 2022-07-27 NOTE — Telephone Encounter (Signed)
Ok to send Prozac 20 mg, #30 w/ 2 Rf (or #90)  Needs appt in 3 months.

## 2022-08-21 ENCOUNTER — Other Ambulatory Visit: Payer: Self-pay | Admitting: Physician Assistant

## 2022-08-22 ENCOUNTER — Other Ambulatory Visit: Payer: Self-pay | Admitting: Physician Assistant

## 2022-09-11 ENCOUNTER — Ambulatory Visit
Admission: RE | Admit: 2022-09-11 | Discharge: 2022-09-11 | Disposition: A | Discharge status: Home / Self Care | Payer: BLUE CROSS/BLUE SHIELD | Source: Ambulatory Visit | Attending: Internal Medicine | Admitting: Internal Medicine

## 2022-09-11 DIAGNOSIS — Z1231 Encounter for screening mammogram for malignant neoplasm of breast: Secondary | ICD-10-CM

## 2022-10-16 ENCOUNTER — Telehealth: Payer: Self-pay | Admitting: Physician Assistant

## 2022-10-16 NOTE — Telephone Encounter (Signed)
Pt called and would like her prozac 20 mg sent to [facility] mail [facility]. She is doing great on the dose

## 2022-10-17 MED ORDER — FLUOXETINE HCL 40 MG PO CAPS: 40.0000 mg | ORAL_CAPSULE | Freq: Every day | ORAL | 0 refills | Status: DC

## 2022-10-17 NOTE — Telephone Encounter (Signed)
Sent!

## 2022-10-22 ENCOUNTER — Telehealth: Payer: Self-pay | Admitting: Physician Assistant

## 2022-10-22 MED ORDER — FLUOXETINE HCL 20 MG PO CAPS: 20.0000 mg | ORAL_CAPSULE | Freq: Every day | ORAL | 0 refills | Status: DC

## 2022-10-22 NOTE — Telephone Encounter (Signed)
Pt lvm that her prozac needs to be 20 mg not 40 mg. Please cancel and  re send the 20 mg to [facility]

## 2022-10-22 NOTE — Telephone Encounter (Signed)
Sent!

## 2022-11-24 ENCOUNTER — Other Ambulatory Visit: Payer: Self-pay | Admitting: Physician Assistant

## 2022-12-19 ENCOUNTER — Emergency Department (HOSPITAL_BASED_OUTPATIENT_CLINIC_OR_DEPARTMENT_OTHER): Payer: BLUE CROSS/BLUE SHIELD

## 2022-12-19 ENCOUNTER — Emergency Department (HOSPITAL_BASED_OUTPATIENT_CLINIC_OR_DEPARTMENT_OTHER)
Admission: EM | Admit: 2022-12-19 | Discharge: 2022-12-19 | Disposition: A | Discharge status: Home / Self Care | Payer: BLUE CROSS/BLUE SHIELD | Attending: Emergency Medicine | Admitting: Emergency Medicine

## 2022-12-19 ENCOUNTER — Other Ambulatory Visit: Payer: Self-pay

## 2022-12-19 DIAGNOSIS — R1084 Generalized abdominal pain: Secondary | ICD-10-CM | POA: Insufficient documentation

## 2022-12-19 DIAGNOSIS — E039 Hypothyroidism, unspecified: Secondary | ICD-10-CM | POA: Diagnosis not present

## 2022-12-19 DIAGNOSIS — K5732 Diverticulitis of large intestine without perforation or abscess without bleeding: Secondary | ICD-10-CM

## 2022-12-19 DIAGNOSIS — M545 Low back pain, unspecified: Secondary | ICD-10-CM | POA: Diagnosis not present

## 2022-12-19 DIAGNOSIS — R110 Nausea: Secondary | ICD-10-CM | POA: Insufficient documentation

## 2022-12-19 DIAGNOSIS — Z79899 Other long term (current) drug therapy: Secondary | ICD-10-CM | POA: Insufficient documentation

## 2022-12-19 LAB — CBC
HCT: 39.2 % (ref 36.0–46.0)
Hemoglobin: 12.8 g/dL (ref 12.0–15.0)
MCH: 31 pg (ref 26.0–34.0)
MCHC: 32.7 g/dL (ref 30.0–36.0)
MCV: 94.9 fL (ref 80.0–100.0)
Platelets: 208 10*3/uL (ref 150–400)
RBC: 4.13 MIL/uL (ref 3.87–5.11)
RDW: 13.5 % (ref 11.5–15.5)
WBC: 6.2 10*3/uL (ref 4.0–10.5)
nRBC: 0 % (ref 0.0–0.2)

## 2022-12-19 LAB — COMPREHENSIVE METABOLIC PANEL
ALT: 14 U/L (ref 0–44)
AST: 16 U/L (ref 15–41)
Albumin: 4.3 g/dL (ref 3.5–5.0)
Alkaline Phosphatase: 73 U/L (ref 38–126)
Anion gap: 7 (ref 5–15)
BUN: 14 mg/dL (ref 6–20)
CO2: 29 mmol/L (ref 22–32)
Calcium: 9.5 mg/dL (ref 8.9–10.3)
Chloride: 102 mmol/L (ref 98–111)
Creatinine, Ser: 0.97 mg/dL (ref 0.44–1.00)
GFR, Estimated: >60 mL/min (ref >60)
Glucose, Bld: 95 mg/dL (ref 70–99)
Potassium: 4 mmol/L (ref 3.5–5.1)
Sodium: 138 mmol/L (ref 135–145)
Total Bilirubin: 0.2 mg/dL (ref <1.2)
Total Protein: 7 g/dL (ref 6.5–8.1)

## 2022-12-19 LAB — URINALYSIS, ROUTINE W REFLEX MICROSCOPIC
Bilirubin Urine: NEGATIVE
Glucose, UA: NEGATIVE mg/dL
Hgb urine dipstick: NEGATIVE
Ketones, ur: NEGATIVE mg/dL
Leukocytes,Ua: NEGATIVE
Nitrite: NEGATIVE
Protein, ur: NEGATIVE mg/dL
Specific Gravity, Urine: 1.012 (ref 1.005–1.030)
pH: 6.5 (ref 5.0–8.0)

## 2022-12-19 LAB — LIPASE, BLOOD: Lipase: 43 U/L (ref 11–51)

## 2022-12-19 MED ORDER — IOHEXOL 300 MG/ML  SOLN
100.0000 mL | Freq: Once | INTRAMUSCULAR | Status: AC | PRN
  Administered 2022-12-19: 80 mL via INTRAVENOUS

## 2022-12-19 MED ORDER — AMOXICILLIN-POT CLAVULANATE 875-125 MG PO TABS: 1.0000 | ORAL_TABLET | Freq: Two times a day (BID) | ORAL | 0 refills | Status: AC

## 2022-12-19 MED ORDER — DIPHENHYDRAMINE HCL 50 MG/ML IJ SOLN
50.0000 mg | Freq: Once | INTRAMUSCULAR | Status: DC
Start: 2022-12-19 — End: 2022-12-20

## 2022-12-19 MED ORDER — METHYLPREDNISOLONE SODIUM SUCC 40 MG IJ SOLR
40.0000 mg | Freq: Once | INTRAMUSCULAR | Status: DC
Start: 2022-12-19 — End: 2022-12-20

## 2022-12-19 MED ORDER — DIPHENHYDRAMINE HCL 25 MG PO CAPS
50.0000 mg | ORAL_CAPSULE | Freq: Once | ORAL | Status: DC
Start: 2022-12-19 — End: 2022-12-20

## 2022-12-19 MED ORDER — AMOXICILLIN-POT CLAVULANATE 875-125 MG PO TABS
1.0000 | ORAL_TABLET | Freq: Once | ORAL | Status: AC
  Administered 2022-12-19: 1 via ORAL
  Filled 2022-12-19: qty 1

## 2022-12-19 MED ORDER — DIPHENHYDRAMINE HCL 50 MG/ML IJ SOLN
50.0000 mg | Freq: Once | INTRAMUSCULAR | Status: AC
Start: 2022-12-19 — End: 2022-12-19
  Administered 2022-12-19: 50 mg via INTRAVENOUS
  Filled 2022-12-19: qty 1

## 2022-12-19 MED ORDER — DIPHENHYDRAMINE HCL 25 MG PO CAPS: 50.0000 mg | ORAL_CAPSULE | Freq: Once | ORAL | Status: AC

## 2022-12-19 MED ORDER — METHYLPREDNISOLONE SODIUM SUCC 40 MG IJ SOLR
40.0000 mg | Freq: Once | INTRAMUSCULAR | Status: AC
Start: 2022-12-19 — End: 2022-12-19
  Administered 2022-12-19: 40 mg via INTRAVENOUS
  Filled 2022-12-19: qty 1

## 2022-12-19 NOTE — ED Provider Notes (Addendum)
[place] EMERGENCY DEPARTMENT AT [facility] Provider Note   CSN: [id number] Arrival date & time: 12/19/22  1527     History  Chief Complaint  Patient presents with   Abdominal Pain    Angie Robinson is a 62 y.o. female with past medical history of hypothyroidism, osteoporosis, vitamin D deficiency, ADHD, total hysterectomy presents to emergency department for evaluation of left-sided abdominal pain and bilateral lower back pain for the past week and a half.  She reports that pain initially it started in lower abdomen and then has since moved to left upper side.  She endorses significant debilitating pain with nausea this morning describing it as "dull to sharp pain".  Abdominal pain is worsened with movement and occasionally after eating if "gassy". Her last BM was this morning and normal.  No urinary symptoms, saddle paresthesia, IVDU, malignancy, nor fever.    She was seen in her PCP today at 1400 and was sent here for imaging.    The history is provided by the patient. No language interpreter was used.  Abdominal Pain Associated symptoms: no chest pain, no chills, no constipation, no cough, no diarrhea, no fatigue, no fever, no nausea, no shortness of breath and no vomiting      Home Medications Prior to Admission medications   Medication Sig Start Date End Date Taking? Authorizing Provider  amphetamine-dextroamphetamine (ADDERALL) 10 MG tablet Take 0.5 tablets (5 mg total) by mouth 2 (two) times daily with breakfast and lunch. 06/12/22   Cherie Ouch, PA-C  BREO ELLIPTA 200-25 MCG/ACT AEPB USE 1 INHALATION BY MOUTH DAILY 06/05/22   Luciano Cutter, MD  calcium carbonate (TUMS - DOSED IN MG ELEMENTAL CALCIUM) 500 MG chewable tablet Chew 1 tablet by mouth daily.    [provider]  Cholecalciferol (VITAMIN D3) 25 MCG (1000 UT) CAPS Take by mouth daily.    [provider]  denosumab (PROLIA) 60 MG/ML SOSY injection See admin instructions.     [provider]  FLUoxetine (PROZAC) 20 MG capsule Take 1 capsule (20 mg total) by mouth daily. 10/22/22   Cherie Ouch, PA-C  hydrOXYzine (ATARAX) 25 MG tablet TAKE 1 TABLET BY MOUTH EVERY 8 Hrs PRN Patient not taking: Reported on 12/11/2021    [provider]  levothyroxine (SYNTHROID) 25 MCG tablet Take 25 mcg by mouth daily before breakfast.    [provider]  montelukast (SINGULAIR) 10 MG tablet Take 10 mg by mouth daily. Patient not taking: Reported on 12/11/2021 06/26/21   [provider]  Multiple Vitamin (MULTIVITAMINS PO)     [provider]  valACYclovir (VALTREX) 1000 MG tablet TAKE AS DIRECTED AS NEEDED    [provider]      Allergies    Other and Ivp dye [iodinated contrast media]    Review of Systems   Review of Systems  Constitutional:  Negative for chills, fatigue and fever.  Respiratory:  Negative for cough, chest tightness, shortness of breath and wheezing.   Cardiovascular:  Negative for chest pain and palpitations.  Gastrointestinal:  Positive for abdominal pain. Negative for constipation, diarrhea, nausea and vomiting.  Neurological:  Negative for dizziness, seizures, weakness, light-headedness, numbness and headaches.    Physical Exam Updated Vital Signs BP 116/79 (BP Location: Left Arm)   Pulse 74   Temp 97.8 F (36.6 C)   Resp 17   Ht 5\' 2"  (1.575 m)   Wt 66.7 kg   SpO2 100%   BMI 26.89  kg/m  Physical Exam Vitals and nursing note reviewed.  Constitutional:      General: She is not in acute distress.    Appearance: Normal appearance.  HENT:     Head: Normocephalic and atraumatic.  Eyes:     Conjunctiva/sclera: Conjunctivae normal.  Cardiovascular:     Rate and Rhythm: Normal rate.  Pulmonary:     Effort: Pulmonary effort is normal. No respiratory distress.     Breath sounds: Normal breath sounds.  Abdominal:     General: Bowel sounds are normal. There is no distension.     Palpations:  Abdomen is soft.     Tenderness: There is generalized abdominal tenderness. There is no right CVA tenderness, left CVA tenderness, guarding or rebound. Negative signs include Murphy's sign, Rovsing's sign and McBurney's sign.     Hernia: No hernia is present.     Comments: No peritoneal signs, Cullen's sign, nor Grey Turner sign  Skin:    General: Skin is warm.     Capillary Refill: Capillary refill takes less than 2 seconds.     Coloration: Skin is not jaundiced or pale.  Neurological:     Mental Status: She is alert and oriented to person, place, and time. Mental status is at baseline.    ED Results / Procedures / Treatments   Labs (all labs ordered are listed, but only abnormal results are displayed) Labs Reviewed  LIPASE, BLOOD  COMPREHENSIVE METABOLIC PANEL  CBC  URINALYSIS, ROUTINE W REFLEX MICROSCOPIC    EKG None  Radiology No results found.  Procedures Procedures    Medications Ordered in ED Medications  diphenhydrAMINE (BENADRYL) capsule 50 mg (has no administration in time range)    Or  diphenhydrAMINE (BENADRYL) injection 50 mg (has no administration in time range)  methylPREDNISolone sodium succinate (SOLU-MEDROL) 40 mg/mL injection 40 mg (40 mg Intravenous Given 12/19/22 1620)    ED Course/ Medical Decision Making/ A&P                                 Medical Decision Making Amount and/or Complexity of Data Reviewed Labs: ordered. Radiology: ordered.  Risk Prescription drug management.   Patient presents to the ED for concern of abdominal pain, this involves an extensive number of treatment options, and is a complaint that carries with it a high risk of complications and morbidity.  The differential diagnosis includes IBS, gastroenteritis, diverticulitis, pancreatitis, cholecystitis.  This is definitely not an exhaustive list   Co morbidities that complicate the patient evaluation  hypothyroidism, osteoporosis, vitamin D deficiency, ADHD, total  hysterectomy    Additional history obtained:  Additional history obtained from Nursing and Outside Medical Records   External records from outside source obtained and reviewed including triage RN note   Lab Tests:  I Ordered, and personally interpreted labs All WNL   Imaging Studies ordered:  I ordered imaging studies including CT abd pelvis  Pending at sign out    Problem List / ED Course:  Abdominal pain CT scan delayed due to patient having allergy to contrast Upon exam, patient is generally mildly tender with no particular area of significant tenderness. She reports she feels "achy all over" due to prolonged abdominal pain Labs unremarkable CT pending at sign out   Social Determinants of Health:  Has PCP   Dispostion:  CT pending at sign out to [employer]. If normal or no emergent abnormalities, patient can be d/c  and follow up outpatient with GI. Will provide outpatient referral.  Final Clinical Impression(s) / ED Diagnoses Final diagnoses:  Generalized abdominal pain    Rx / DC Orders ED Discharge Orders     None         Judithann Sheen, PA 12/19/22 1839    Glyn Ade, MD 12/19/22 2313

## 2022-12-19 NOTE — ED Provider Notes (Signed)
  Physical Exam  BP 116/79 (BP Location: Left Arm)   Pulse 74   Temp 97.8 F (36.6 C)   Resp 17   Ht 5\' 2"  (1.575 m)   Wt 66.7 kg   SpO2 100%   BMI 26.89 kg/m   Physical Exam  Procedures  Procedures  ED Course / MDM    Medical Decision Making Amount and/or Complexity of Data Reviewed Labs: ordered. Radiology: ordered.  Risk Prescription drug management.   Patient care taken over at shift change.  Patient reports left-sided lower abdominal pain intermittently for the past few weeks.  She denies fever, nausea, dysuria, or diarrhea.  She states that the pain worsened today and advised by her primary care provider to come to the ED for further evaluation and imaging.  CT showed stranding around the low descending colon likely representing acute diverticulitis without abscess.  Discussed findings with patient.  First dose Augmentin given in the ED today.  Prescription sent to the pharmacy. She is hemodynamically stable and safe for discharge home.  Return precautions provided.    Maxwell Marion, PA-C 12/19/22 2211    Glyn Ade, MD 12/19/22 [phone]

## 2022-12-19 NOTE — ED Notes (Signed)
Pt. States that after voiding the pain has spread to bilateral lower abd

## 2022-12-19 NOTE — ED Notes (Signed)
Pt cleared to eat. Given juice and snacks.

## 2022-12-19 NOTE — ED Triage Notes (Signed)
Pt POV from PCP reporting lower abd pain for the past few weeks. Advised to come to ED for CT.

## 2022-12-19 NOTE — Discharge Instructions (Signed)
As discussed, your imaging shows early diverticulitis.  Take Augmentin twice a day for the next 7 days.  You are given the first dose in the ED tonight.  It takes about 48 hours for antibiotics to take effect.  Follow-up with your primary care provider in the next 5 to 7 days for reevaluation of your symptoms.  Return to the ED if your symptoms worsen in the interim.

## 2023-01-14 ENCOUNTER — Other Ambulatory Visit: Payer: Self-pay | Admitting: Physician Assistant

## 2023-01-14 NOTE — Telephone Encounter (Signed)
Please schedule pt an appt lv 05/28

## 2023-01-24 NOTE — Telephone Encounter (Signed)
 Called - LM to sched appt

## 2023-01-30 NOTE — Telephone Encounter (Signed)
 Spoke to pt at 10:49a.  She is wanting to come back to Angie Robinson for med management even though last note said she was going to get her meds from her PCP.    Sending to clinical because I don't know if she has any medications pending.  Next appt 3/7

## 2023-03-22 ENCOUNTER — Ambulatory Visit: Payer: BLUE CROSS/BLUE SHIELD | Admitting: Physician Assistant

## 2023-03-22 ENCOUNTER — Encounter: Payer: Self-pay | Admitting: Physician Assistant

## 2023-03-22 DIAGNOSIS — F3342 Major depressive disorder, recurrent, in full remission: Secondary | ICD-10-CM | POA: Diagnosis not present

## 2023-03-22 DIAGNOSIS — F411 Generalized anxiety disorder: Secondary | ICD-10-CM

## 2023-03-22 MED ORDER — FLUOXETINE HCL 20 MG PO CAPS: 20.0000 mg | ORAL_CAPSULE | Freq: Every day | ORAL | 3 refills | Status: DC

## 2023-03-22 NOTE — Progress Notes (Signed)
 [facility] Med Check  Patient ID: Angie Robinson,  MRN: [id number]  PCP: Merri Brunette, MD  Date of Evaluation: 03/22/2023 Time spent:20 minutes  Chief Complaint:  Chief Complaint   Anxiety; Depression    HISTORY/CURRENT STATUS: HPI  For routine f/u.  She is doing well on the Prozac. Needs the hydroxyzine sometimes for sleep but not having to take it for anxiety.   Patient is able to enjoy things.  Energy and motivation are good.  Work is going well.   No extreme sadness, tearfulness, or feelings of hopelessness.  ADLs and personal hygiene are normal.   Denies any changes in concentration, making decisions, or remembering things.  Appetite has not changed.  Weight is stable.   Denies suicidal or homicidal thoughts.  Patient denies increased energy with decreased need for sleep, increased talkativeness, racing thoughts, impulsivity or risky behaviors, increased spending, increased libido, grandiosity, increased irritability or anger, paranoia, or hallucinations.  Denies dizziness, syncope, seizures, numbness, tingling, tremor, tics, unsteady gait, slurred speech, confusion. Denies muscle or joint pain, stiffness, or dystonia.  Individual Medical History/ Review of Systems: Changes? :Yes   she had diverticulitis 12/2022  Past Psychiatric History:    H/o anxiety, depression    Past medications for mental health diagnoses include: Lexapro has helped anxiety, Zoloft for a long time but quit working, took Wellbutrin for about a month at some point for wt issues from SSRI (she does not remember if the Wellbutrin helped with weight or not.) Prozac   H/O of TBI.  Cracked her skull after a horse ran over her, had bleeding, frontal and occipital, didn't need surgery, the bleeding finally resolved. Lost her memory for awhile.   Allergies: Other and Ivp dye [iodinated contrast media]  Current Medications:  Current Outpatient Medications:    calcium carbonate (TUMS - DOSED IN MG  ELEMENTAL CALCIUM) 500 MG chewable tablet, Chew 1 tablet by mouth daily., Disp: , Rfl:    Cholecalciferol (VITAMIN D3) 25 MCG (1000 UT) CAPS, Take by mouth daily., Disp: , Rfl:    denosumab (PROLIA) 60 MG/ML SOSY injection, See admin instructions., Disp: , Rfl:    hydrOXYzine (ATARAX) 25 MG tablet, , Disp: , Rfl:    levothyroxine (SYNTHROID) 25 MCG tablet, Take 25 mcg by mouth daily before breakfast., Disp: , Rfl:    BREO ELLIPTA 200-25 MCG/ACT AEPB, USE 1 INHALATION BY MOUTH DAILY (Patient not taking: Reported on 03/22/2023), Disp: 180 each, Rfl: 3   FLUoxetine (PROZAC) 20 MG capsule, Take 1 capsule (20 mg total) by mouth daily., Disp: 90 capsule, Rfl: 3   montelukast (SINGULAIR) 10 MG tablet, Take 10 mg by mouth daily. (Patient not taking: Reported on 03/22/2023), Disp: , Rfl:    Multiple Vitamin (MULTIVITAMINS PO), , Disp: , Rfl:    valACYclovir (VALTREX) 1000 MG tablet, TAKE AS DIRECTED AS NEEDED, Disp: , Rfl:  Medication Side Effects: none  Family Medical/ Social History: Changes?  See HPI  MENTAL HEALTH EXAM:  There were no vitals taken for this visit.There is no height or weight on file to calculate BMI.  General Appearance: Casual and Well Groomed  Eye Contact:  Good  Speech:  Clear and Coherent and Normal Rate  Volume:  Normal  Mood:  Euthymic  Affect:  Congruent  Thought Process:  Goal Directed and Descriptions of Associations: Circumstantial  Orientation:  Full (Time, Place, and Person)  Thought Content: Logical   Suicidal Thoughts:  No  Homicidal Thoughts:  No  Memory:  Immediate;  Good Recent;   Good  Judgement:  Good  Insight:  Good  Psychomotor Activity:  Normal  Concentration:  Concentration: Good and Attention Span: Good  Recall:  Good  Fund of Knowledge: Good  Language: Good  Assets:  Communication Skills Desire for Improvement Financial Resources/Insurance Housing Physical Health Transportation Vocational/Educational  ADL's:  Intact  Cognition: WNL   Prognosis:  Good   DIAGNOSES:    ICD-10-CM   1. Recurrent major depression in full remission (HCC)  F33.42     2. Generalized anxiety disorder  F41.1      Receiving Psychotherapy: No   RECOMMENDATIONS:  PDMP reviewed.  Adderall filled 06/20/2022. I provided 20 minutes of face to face time during this encounter, including time spent before and after the visit in records review, medical decision making, counseling pertinent to today's visit, and charting.   Angie Robinson is doing well on current medications and no changes will be made.  Continue Prozac 40 mg, 1 p.o. daily. Continue hydroxyzine 25 mg, 1 p.o. 3 times daily as needed anxiety or sleep. Continue multivitamin and calcium daily.  Also OTC vitamin D. Return in 1 year.  Melony Overly, PA-C

## 2023-04-22 ENCOUNTER — Ambulatory Visit (INDEPENDENT_AMBULATORY_CARE_PROVIDER_SITE_OTHER): Payer: BLUE CROSS/BLUE SHIELD

## 2023-07-24 ENCOUNTER — Telehealth: Payer: Self-pay | Admitting: Physician Assistant

## 2023-07-24 MED ORDER — FLUOXETINE HCL 40 MG PO CAPS: 40.0000 mg | ORAL_CAPSULE | Freq: Every day | ORAL | 3 refills | Status: AC

## 2023-07-24 NOTE — Telephone Encounter (Signed)
 Rx for 40 mg Prozac  sent to [facility].

## 2023-07-24 NOTE — Telephone Encounter (Signed)
 Yes, it's fine.  Ok to do 90 days w/3 RF.

## 2023-07-24 NOTE — Telephone Encounter (Signed)
 Pt Lvm @ 10:05a stating that Verneita had her on Prozac  40mg , then they decreased it to 20mg .  She is asking if she can go back to 40mg .  Send script to [facility]  Next appt 3/12

## 2023-07-24 NOTE — Telephone Encounter (Signed)
 In March Prozac  dose was decreased to 20 mg. She asks today if she can go back to 40 mg. She reports gaining weight and anxious about eating. Dtr is a NP and said she probably need to increase dose. She reported having some 40 mg capsules left and has taken them for a few days and reports anxiety is gone.  Will send in Rx if ok.

## 2023-10-01 ENCOUNTER — Telehealth (INDEPENDENT_AMBULATORY_CARE_PROVIDER_SITE_OTHER): Payer: Self-pay

## 2023-10-01 NOTE — Telephone Encounter (Signed)
 Patient called and left a message, she has lost a hearing aid, she also has some questions she would like answered, she did not state the nature of the questions

## 2023-10-18 ENCOUNTER — Ambulatory Visit (INDEPENDENT_AMBULATORY_CARE_PROVIDER_SITE_OTHER): Admitting: Audiology

## 2023-10-18 DIAGNOSIS — H903 Sensorineural hearing loss, bilateral: Secondary | ICD-10-CM

## 2023-10-18 NOTE — Progress Notes (Signed)
  [address] [place], [place] [address] [phone]  Hearing Aid Check     Angie Robinson comes for a scheduled appointment  to pick up a right replacement hearing aid. It was claimed as lost under her lost and damage warranty.      Right Left  Hearing aid manufacturer Linx 5 SN: [id number] Previous: [phone] LINX 5  [id number]  Hearing aid style Custom ITE  Custom ITE  Hearing aid battery 312 312  Receiver    Dome/ custom earpiece    Retention wire    Warranty expiration date    Loss and Damage USED unkown  Additional accessories Expiration date    Initial fitting date    [facility]: [facility]      Actions taken: Programmed both aids together. Patient said they sound OK. Both aids paired to the phone.  Services fee: $300 was paid at checkout.     Recommend: Return for a hearing aid check , as needed. Return for a hearing evaluation and to see an ENT, if concerns with hearing changes arise.    Angie Robinson MARIE LEROUX-MARTINEZ, AUD

## 2023-10-29 ENCOUNTER — Other Ambulatory Visit: Payer: Self-pay | Admitting: Registered Nurse

## 2023-10-29 DIAGNOSIS — Z1231 Encounter for screening mammogram for malignant neoplasm of breast: Secondary | ICD-10-CM

## 2023-10-31 ENCOUNTER — Ambulatory Visit
Admission: RE | Admit: 2023-10-31 | Discharge: 2023-10-31 | Disposition: A | Discharge status: Home / Self Care | Source: Ambulatory Visit | Attending: Registered Nurse | Admitting: Registered Nurse

## 2023-10-31 DIAGNOSIS — Z1231 Encounter for screening mammogram for malignant neoplasm of breast: Secondary | ICD-10-CM

## 2023-11-21 ENCOUNTER — Telehealth (INDEPENDENT_AMBULATORY_CARE_PROVIDER_SITE_OTHER): Payer: Self-pay

## 2023-11-21 NOTE — Telephone Encounter (Signed)
 Pt LVM stating that one of her hearing aids is not working properly and that her hearing aids also need turned down. Pt stated she would like a phone call back. Thank you

## 2023-11-22 ENCOUNTER — Telehealth (INDEPENDENT_AMBULATORY_CARE_PROVIDER_SITE_OTHER): Payer: Self-pay | Admitting: Audiology

## 2023-11-22 NOTE — Telephone Encounter (Signed)
 Called patient per Dr. Tex request.  Patient Angie Robinson stating her hearing aids were not working properly and she also needed them turned down.  Scheduled an appt for a hearing aid check on 11/25/2023 @ 8:30am and Angie Robinson for patient to call back to confirm appt.

## 2023-11-25 ENCOUNTER — Ambulatory Visit (INDEPENDENT_AMBULATORY_CARE_PROVIDER_SITE_OTHER): Admitting: Audiology

## 2023-12-03 ENCOUNTER — Ambulatory Visit (INDEPENDENT_AMBULATORY_CARE_PROVIDER_SITE_OTHER): Payer: Self-pay | Admitting: Audiology

## 2023-12-03 DIAGNOSIS — H903 Sensorineural hearing loss, bilateral: Secondary | ICD-10-CM

## 2023-12-03 NOTE — Progress Notes (Unsigned)
  [address], [address] [place], [place] [address] [phone]  Hearing Aid Check     Angie Robinson comes for a scheduled appointment for a hearing aid check.  Time in:*** Time out:*** Accompanied by:***   Right Left  Hearing aid manufacturer    Hearing aid style    Hearing aid battery    Receiver    Dome/ custom earpiece    Retention wire    Warranty expiration date    Loss and Damage    Additional accessories Expiration date    Initial fitting date    Device was fit at:    Hearing aid services Bundled until:  Bundled until:     Chief complaint: Patient reports ***.  Actions taken: Inspection of the device and listening check showed that ***.  Services fee: $*** was paid at checkout.  Patient was oriented about ***.  Recommend: Return for a hearing aid check ***. Return for a hearing evaluation and to see an ENT, if concerns with hearing changes arise. ***  Angie Robinson Angie Robinson, AUD

## 2024-01-22 ENCOUNTER — Other Ambulatory Visit (INDEPENDENT_AMBULATORY_CARE_PROVIDER_SITE_OTHER): Payer: Self-pay | Admitting: Otolaryngology

## 2024-01-22 DIAGNOSIS — H903 Sensorineural hearing loss, bilateral: Secondary | ICD-10-CM

## 2024-01-23 ENCOUNTER — Ambulatory Visit (INDEPENDENT_AMBULATORY_CARE_PROVIDER_SITE_OTHER): Admitting: Audiology

## 2024-01-23 ENCOUNTER — Ambulatory Visit (INDEPENDENT_AMBULATORY_CARE_PROVIDER_SITE_OTHER): Payer: Self-pay | Admitting: Audiology

## 2024-01-23 DIAGNOSIS — H903 Sensorineural hearing loss, bilateral: Secondary | ICD-10-CM | POA: Diagnosis not present

## 2024-01-23 NOTE — Progress Notes (Signed)
" °  [address] [place], [place] [address] [phone]  Audiological Evaluation    Name: Angie Robinson     DOB:   1960/09/10      MRN:   [id number]                                                                                     Service Date: 01/23/2024     Accompanied by: self     Patient comes today after Dr. Karis, ENT sent a referral for a hearing evaluation due to concerns with hearing loss.   Symptoms Yes Details  Hearing loss  [x]  Known hearing loss, unclear if has had any changes  Tinnitus  []    Ear pain/ infections/pressure  []    Balance problems  []    Noise exposure history  []    Previous ear surgeries  []    Family history of hearing loss  []    Amplification  [x]  Has a set of [employer]ound custom aids that were fit [facility].  Other  []      Otoscopy: Right ear: Clear external ear canal and notable landmarks visualized on the tympanic membrane. Left ear:  Clear external ear canal and notable landmarks visualized on the tympanic membrane.  Tympanometry: Right ear: Type A - Normal external ear canal volume with normal middle ear pressure and normal tympanic membrane compliance. Findings are consistent with normal middle ear function. Left ear: Type C - Normal external ear canal volume with negative middle ear pressure and normal or reduced tympanic membrane compliance. Findings are consistent with Eustachian tube dysfunction.    Hearing Evaluation The hearing test results were completed under headphones and re-checked with inserts and results are deemed to be of good reliability. Test technique:  conventional    Pure tone Audiometry: Right ear- Mild to moderate  sensorineural hearing loss from 125 Hz - 3000 Hz, rising to normal hearing from 4000-8000 Hz. Left ear-  Mild to moderate sensorineural hearing loss from 125 Hz - 3000 Hz, rising to normal from 4000-8000 Hz.  Speech Audiometry: Right ear- Speech Reception Threshold (SRT) was obtained at  35 dBHL. Left ear-Speech Reception Threshold (SRT) was obtained at 40 dBHL.   Word Recognition Score Tested using NU-6 (MLV) Right ear: 96% was obtained at a presentation level of 75 dBHL with contralateral masking which is deemed as  excellent. Left ear: 96% was obtained at a presentation level of 80 dBHL with contralateral masking which is deemed as  excellent.   Impression: There is not a significant difference in pure-tone thresholds between ears., There is not a significant difference in the word recognition score in between ears. There was a decline in hearing of about 10 dBHL for some pitches in both ears.   Recommendations: Follow up with ENT. Repeat audiogram in 2 -3 years, before with concerns of hearing changes. Hearing aid check to verify hearing aid function.   Malin Sambrano MARIE LEROUX-MARTINEZ, AUD  "

## 2024-01-24 NOTE — Progress Notes (Signed)
" °  [address], [address] [place], [place] [address] [phone]  Hearing Aid Check     Angie Robinson was added to the schedule for a  hearing aid check.  Time in:3:50PM Time out:4:10PM Accompanied ab:[id number]   Right Left  Hearing aid manufacturer ReSound LiNX Quattro 5 [id number] ReSound LiNX Quattro 5  SN: [id number]  Hearing aid style In the canal In the canal  Hearing aid battery 312 312  Receiver Low power Low power  Dome/ custom earpiece N/A N/A  Warranty expiration date 06-17-2025 06-17-2025  Loss and Damage unknown unknown  Additional accessories Expiration date    Initial fitting date 06-21-2022 06-21-2022  Device [facility]: [facility]    Chief complaint: Patient reports the left aid stopped working two days after I fixed it for her. Today it was found to be cogged with wax..  Actions taken: Cleaned both devices and replaced wax filters. Listening check was good. Patient will change wax filter at home when she perceives a decline   Services fee: $0 was paid at checkout.  Patient was instructed on how to change the wax filter at home if the hearing aid is not functioning properly. If this does not resolve the issue, she should drop off the aid so we can send it to [facility] for evaluation. I would like to rule out an intermittent problem, as the patient reports sweating while wearing the devices. The patient should return for a scheduled appointment to have her hearing aid settings programmed based on todays audiogram and to complete live speech mapping  Recommend: Return for a hearing aid check to complete REM and load today's audiogram results (they were added to the software, not to the aid yet). Return for a hearing evaluation and to see an ENT, if concerns with hearing changes arise.    [name], AUD "

## 2024-01-27 ENCOUNTER — Telehealth: Admitting: Physician Assistant

## 2024-01-27 DIAGNOSIS — B9689 Other specified bacterial agents as the cause of diseases classified elsewhere: Secondary | ICD-10-CM

## 2024-01-27 DIAGNOSIS — J019 Acute sinusitis, unspecified: Secondary | ICD-10-CM

## 2024-01-27 MED ORDER — IPRATROPIUM BROMIDE 0.03 % NA SOLN: 2.0000 | Freq: Two times a day (BID) | NASAL | 0 refills | Status: AC

## 2024-01-27 MED ORDER — AMOXICILLIN-POT CLAVULANATE 875-125 MG PO TABS: 1.0000 | ORAL_TABLET | Freq: Two times a day (BID) | ORAL | 0 refills | Status: AC

## 2024-01-27 NOTE — Progress Notes (Signed)
 Message sent to patient requesting further input regarding current symptoms. Awaiting patient response.

## 2024-01-27 NOTE — Progress Notes (Signed)
 E-Visit for Sinus Problems  We are sorry that you are not feeling well.  Here is how we plan to help!  Based on what you have shared with me it looks like you have sinusitis.  Sinusitis is inflammation and infection in the sinus cavities of the head.  Based on your presentation I believe you most likely have Acute Bacterial Sinusitis.  This is an infection caused by bacteria and is treated with antibiotics. I have prescribed Augmentin  875mg /125mg  one tablet twice daily with food, for 7 days. and I have also prescribed Ipratropium Bromide Nasal Spray Use 1 spray in each nostril twice daily as needed for drainage; discontinue if too drying You may use an oral decongestant such as Mucinex D or if you have glaucoma or high blood pressure use plain Mucinex. Saline nasal spray help and can safely be used as often as needed for congestion.  If you develop worsening sinus pain, fever or notice severe headache and vision changes, or if symptoms are not better after completion of antibiotic, please schedule an appointment with a health care provider.    Sinus infections are not as easily transmitted as other respiratory infection, however we still recommend that you avoid close contact with loved ones, especially the very young and elderly.  Remember to wash your hands thoroughly throughout the day as this is the number one way to prevent the spread of infection!  Home Care: Only take medications as instructed by your medical team. Complete the entire course of an antibiotic. Do not take these medications with alcohol. A steam or ultrasonic humidifier can help congestion.  You can place a towel over your head and breathe in the steam from hot water coming from a faucet. Avoid close contacts especially the very young and the elderly. Cover your mouth when you cough or sneeze. Always remember to wash your hands.  Get Help Right Away If: You develop worsening fever or sinus pain. You develop a severe head  ache or visual changes. Your symptoms persist after you have completed your treatment plan.  Make sure you Understand these instructions. Will watch your condition. Will get help right away if you are not doing well or get worse.  Your e-visit answers were reviewed by a board certified advanced clinical practitioner to complete your personal care plan.  Depending on the condition, your plan could have included both over the counter or prescription medications.  If there is a problem please reply  once you have received a response from your provider.  Your safety is important to us .  If you have drug allergies check your prescription carefully.    You can use MyChart to ask questions about today's visit, request a non-urgent call back, or ask for a work or school excuse for 24 hours related to this e-Visit. If it has been greater than 24 hours you will need to follow up with your provider, or enter a new e-Visit to address those concerns.  You will get an e-mail in the next two days asking about your experience.  I hope that your e-visit has been valuable and will speed your recovery. Thank you for using e-visits.  I have spent 5 minutes in review of e-visit questionnaire, review and updating patient chart, medical decision making and response to patient.   Elsie Velma Lunger, PA-C

## 2024-02-27 ENCOUNTER — Ambulatory Visit (INDEPENDENT_AMBULATORY_CARE_PROVIDER_SITE_OTHER): Admitting: Audiology

## 2024-03-25 IMAGING — CT CT CARDIAC CORONARY ARTERY CALCIUM SCORE
3 series · 14 of 20 positions shown, 16 images · non-contrast
Comparison: None Available.

CLINICAL DATA: Screening

EXAM:
CT CARDIAC CORONARY ARTERY CALCIUM SCORE
TECHNIQUE: Non-contrast imaging through the heart was performed using
prospective ECG gating. Image post processing was performed on an
independent workstation, allowing for quantitative analysis of the
heart and coronary arteries. Note that this exam targets the heart
and the chest was not imaged in its entirety.

[Series 2: calcium scoring 2.00 qr36 bestdiast 68% hrt calciu · axial · 0.40mm/px · z∈[+1738,+1834]mm · 4 of 80 slices shown]
[im 16/80  vessel]
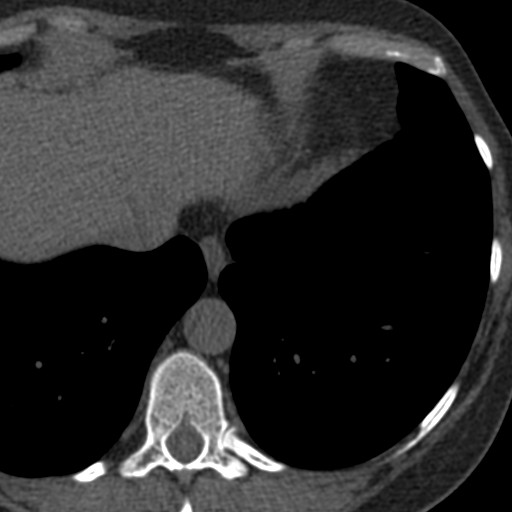
[im 32/80  vessel]
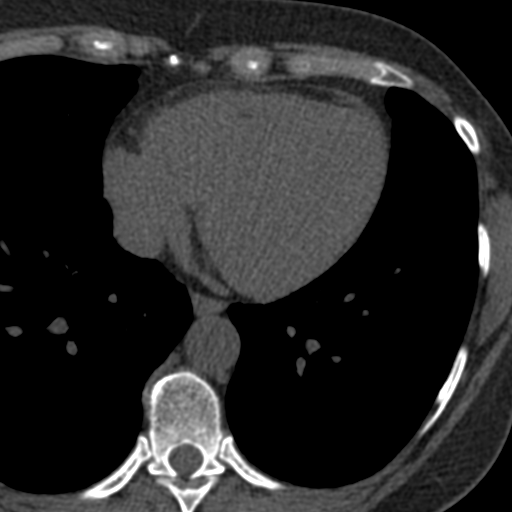
[im 48/80  vessel]
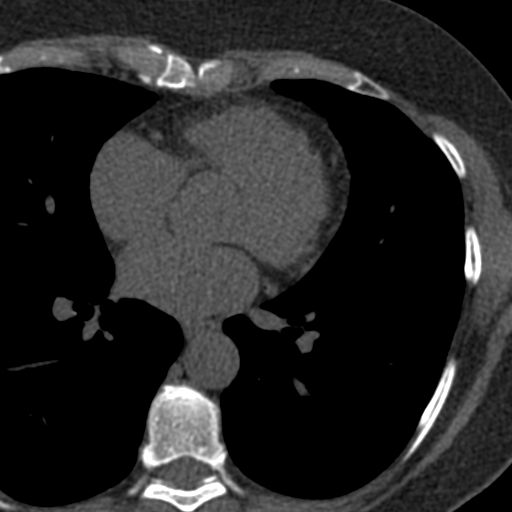
[im 64/80  vessel]
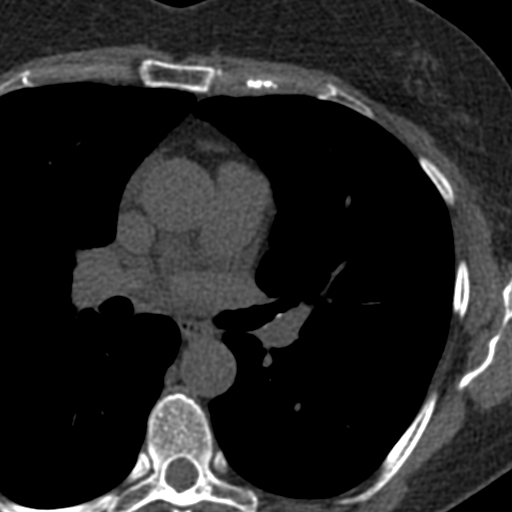

[Series 3: calcium scoring 2.00 br40 bestdiast 68% axial · axial · 0.51mm/px · z∈[+1734,+1838]mm · 5 of 80 slices shown, 7 images]
[im 14/80  vessel]
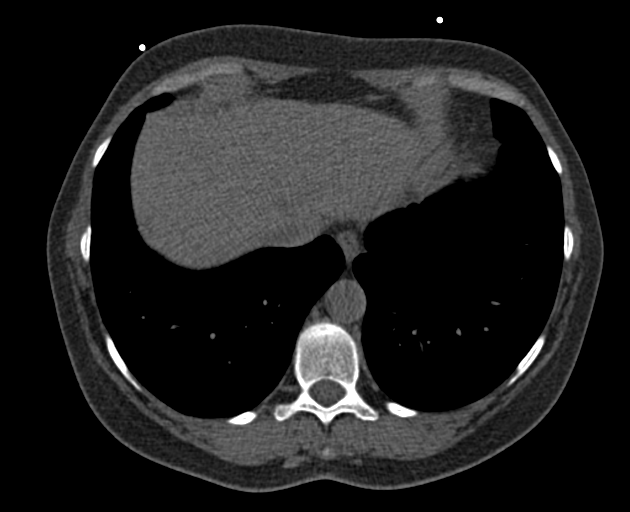
[im 14/80  lung]
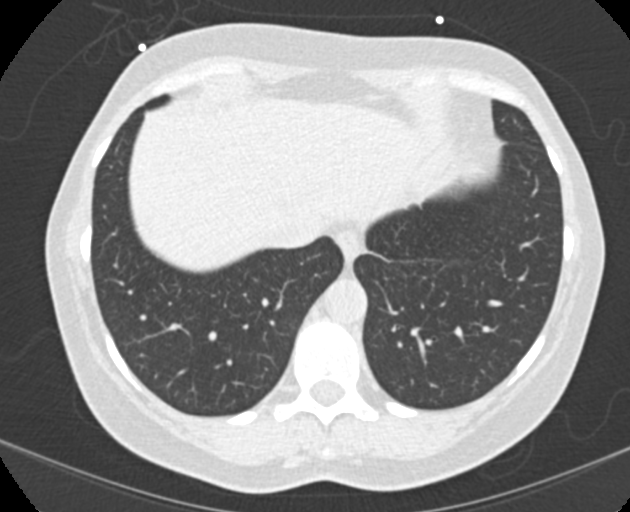
[im 27/80  vessel]
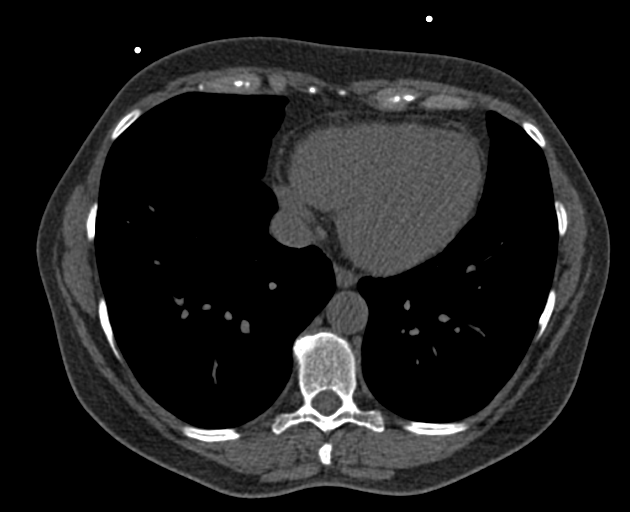
[im 40/80  vessel]
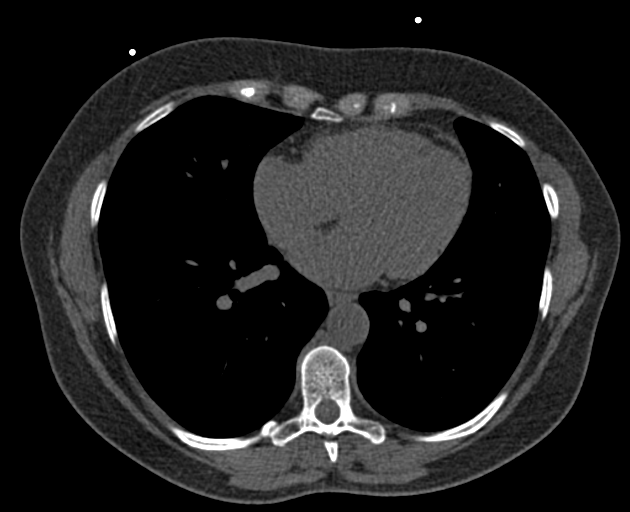
[im 53/80  vessel]
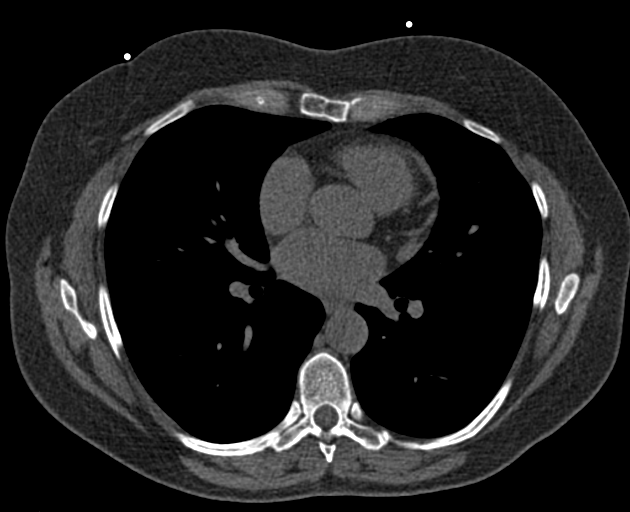
[im 66/80  vessel]
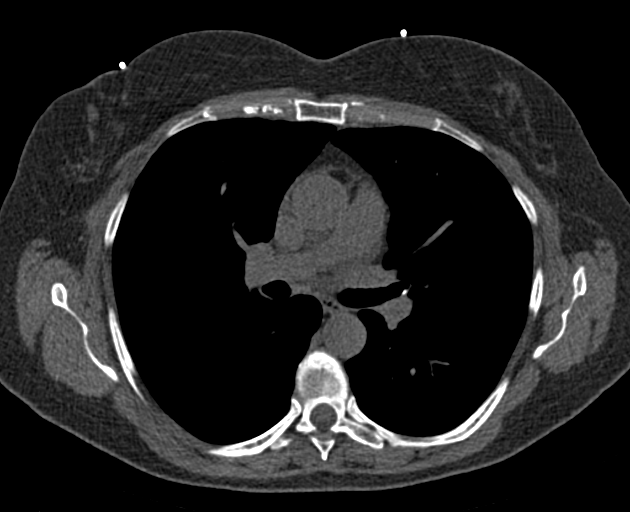
[im 66/80  lung]
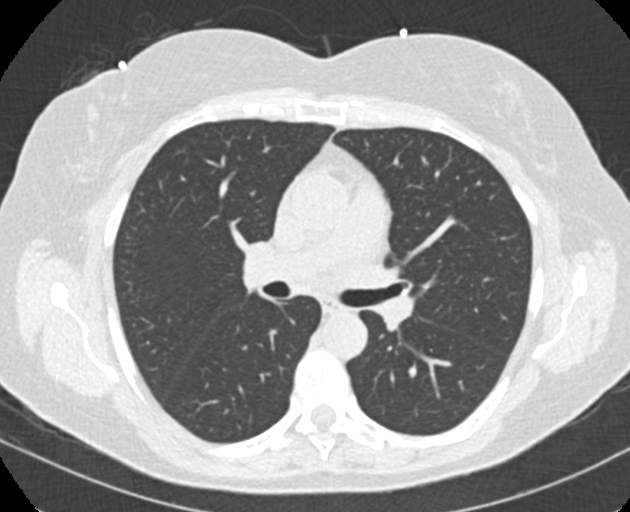

[Series 9: calcium scoring 2.00 br60 bestdiast 68% lungs · axial · 0.51mm/px · z∈[+1734,+1838]mm · 5 of 80 slices shown]
[im 14/80  vessel]
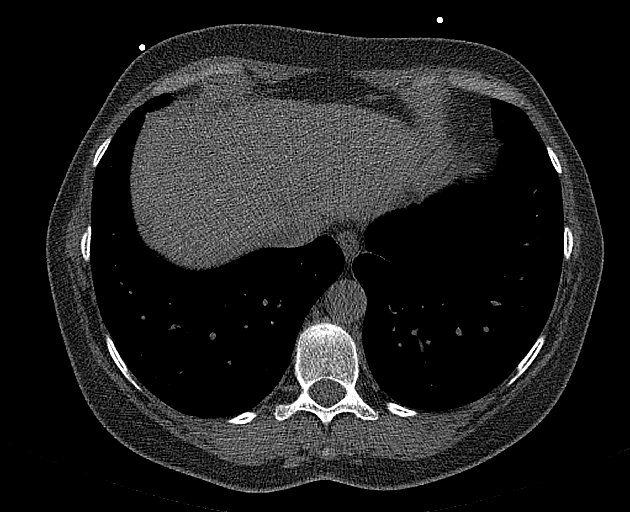
[im 27/80  vessel]
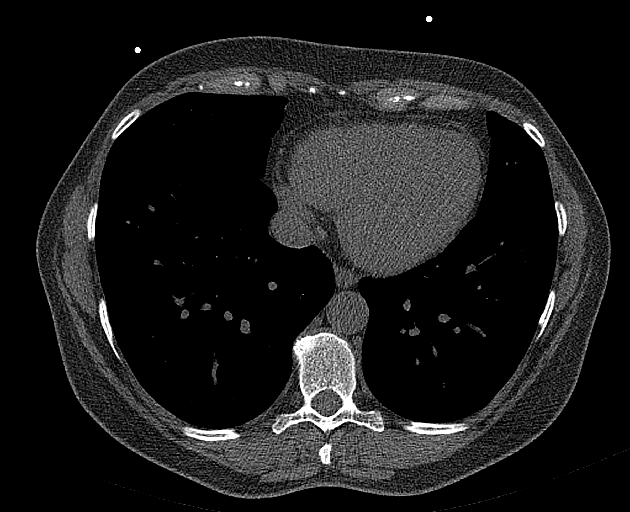
[im 40/80  vessel]
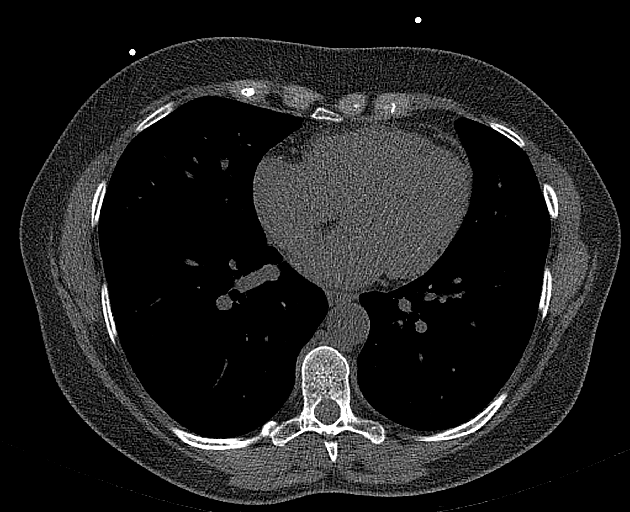
[im 53/80  vessel]
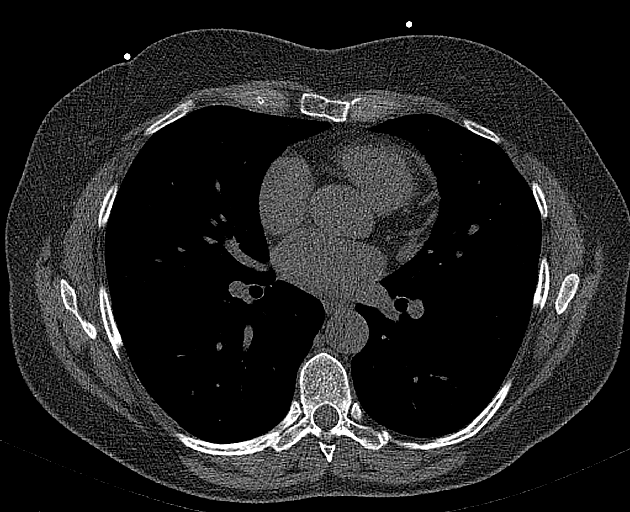
[im 66/80  vessel]
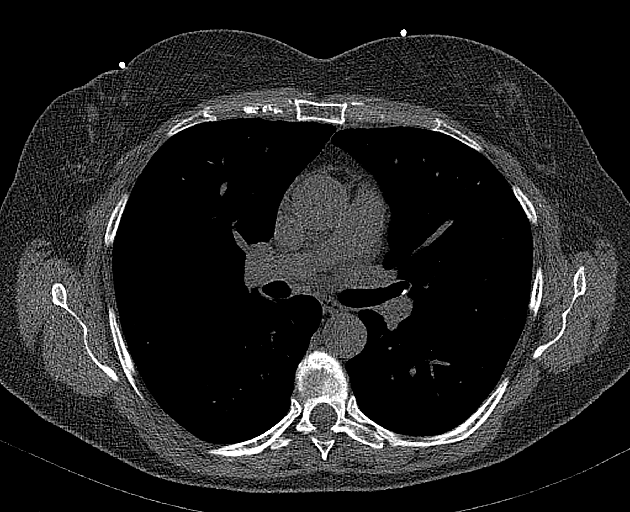

[14 of 20 positions shown; findings below may reference images not displayed]

FINDINGS: CORONARY CALCIUM SCORES:

Left Main: 0

LAD: 0

LCx: 0

RCA: 0

Total Agatston Score: 0

[HOSPITAL] percentile: 0

AORTA MEASUREMENTS:

Ascending Aorta: 27 mm

Descending Aorta: 20 mm

OTHER FINDINGS:

Heart is normal size. Aorta normal caliber. No adenopathy. No
confluent opacities or effusions. No acute findings in the upper
abdomen. Chest wall soft tissues are unremarkable. No acute bony
abnormality.
IMPRESSION: No visible coronary artery calcifications. Total coronary calcium
score of 0.

No acute or significant extracardiac abnormality.

## 2024-03-26 ENCOUNTER — Ambulatory Visit: Payer: Self-pay | Admitting: Physician Assistant

## 2024-03-26 ENCOUNTER — Ambulatory Visit: Admitting: Physician Assistant
